# Patient Record
Sex: Male | Born: 1985
Health system: Southern US, Community
[De-identification: ages and names within clinical notes are randomized; demographics above are authoritative.]

## PROBLEM LIST (undated history)

## (undated) DIAGNOSIS — K2270 Barrett's esophagus without dysplasia: Secondary | ICD-10-CM

## (undated) DIAGNOSIS — F419 Anxiety disorder, unspecified: Secondary | ICD-10-CM

## (undated) DIAGNOSIS — F32A Depression, unspecified: Secondary | ICD-10-CM

## (undated) DIAGNOSIS — J939 Pneumothorax, unspecified: Secondary | ICD-10-CM

## (undated) DIAGNOSIS — F329 Major depressive disorder, single episode, unspecified: Secondary | ICD-10-CM

## (undated) HISTORY — PX: PLEURAL SCARIFICATION: SHX748

## (undated) HISTORY — DX: Barrett's esophagus without dysplasia: K22.70

## (undated) HISTORY — DX: Depression, unspecified: F32.A

## (undated) HISTORY — DX: Major depressive disorder, single episode, unspecified: F32.9

---
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
## 2007-02-19 ENCOUNTER — Emergency Department (HOSPITAL_COMMUNITY): Admission: EM | Admit: 2007-02-19 | Discharge: 2007-02-19 | Payer: Self-pay | Admitting: Emergency Medicine

## 2007-02-20 ENCOUNTER — Emergency Department (HOSPITAL_COMMUNITY): Admission: EM | Admit: 2007-02-20 | Discharge: 2007-02-20 | Payer: Self-pay | Admitting: Emergency Medicine

## 2007-04-20 ENCOUNTER — Ambulatory Visit (HOSPITAL_COMMUNITY): Admission: RE | Admit: 2007-04-20 | Discharge: 2007-04-20 | Payer: Self-pay | Admitting: Orthopedic Surgery

## 2007-07-11 ENCOUNTER — Emergency Department (HOSPITAL_COMMUNITY): Admission: EM | Admit: 2007-07-11 | Discharge: 2007-07-12 | Payer: Self-pay | Admitting: Emergency Medicine

## 2007-07-18 ENCOUNTER — Emergency Department (HOSPITAL_COMMUNITY): Admission: EM | Admit: 2007-07-18 | Discharge: 2007-07-18 | Payer: Self-pay | Admitting: Emergency Medicine

## 2007-12-20 ENCOUNTER — Ambulatory Visit: Payer: Self-pay | Admitting: Internal Medicine

## 2007-12-21 ENCOUNTER — Encounter: Payer: Self-pay | Admitting: Internal Medicine

## 2007-12-21 ENCOUNTER — Inpatient Hospital Stay (HOSPITAL_COMMUNITY): Admission: EM | Admit: 2007-12-21 | Discharge: 2007-12-25 | Payer: Self-pay | Admitting: Emergency Medicine

## 2007-12-24 ENCOUNTER — Encounter: Payer: Self-pay | Admitting: Internal Medicine

## 2007-12-25 ENCOUNTER — Emergency Department (HOSPITAL_COMMUNITY): Admission: EM | Admit: 2007-12-25 | Discharge: 2007-12-26 | Payer: Self-pay | Admitting: Emergency Medicine

## 2007-12-26 ENCOUNTER — Ambulatory Visit: Payer: Self-pay | Admitting: Cardiovascular Disease

## 2007-12-26 ENCOUNTER — Inpatient Hospital Stay (HOSPITAL_COMMUNITY): Admission: EM | Admit: 2007-12-26 | Discharge: 2007-12-29 | Payer: Self-pay | Admitting: Emergency Medicine

## 2007-12-28 ENCOUNTER — Encounter (INDEPENDENT_AMBULATORY_CARE_PROVIDER_SITE_OTHER): Payer: Self-pay | Admitting: Internal Medicine

## 2008-01-02 ENCOUNTER — Telehealth: Payer: Self-pay | Admitting: Family Medicine

## 2008-01-03 ENCOUNTER — Ambulatory Visit: Payer: Self-pay | Admitting: Family Medicine

## 2008-01-03 DIAGNOSIS — F3289 Other specified depressive episodes: Secondary | ICD-10-CM | POA: Insufficient documentation

## 2008-01-03 DIAGNOSIS — F411 Generalized anxiety disorder: Secondary | ICD-10-CM | POA: Insufficient documentation

## 2008-01-03 DIAGNOSIS — K279 Peptic ulcer, site unspecified, unspecified as acute or chronic, without hemorrhage or perforation: Secondary | ICD-10-CM | POA: Insufficient documentation

## 2008-01-03 DIAGNOSIS — F329 Major depressive disorder, single episode, unspecified: Secondary | ICD-10-CM | POA: Insufficient documentation

## 2008-01-03 DIAGNOSIS — J4530 Mild persistent asthma, uncomplicated: Secondary | ICD-10-CM | POA: Insufficient documentation

## 2008-01-05 ENCOUNTER — Telehealth: Payer: Self-pay | Admitting: Family Medicine

## 2008-01-30 ENCOUNTER — Telehealth: Payer: Self-pay | Admitting: Family Medicine

## 2008-02-01 ENCOUNTER — Telehealth: Payer: Self-pay | Admitting: Family Medicine

## 2008-02-03 ENCOUNTER — Telehealth: Payer: Self-pay | Admitting: Family Medicine

## 2008-02-06 ENCOUNTER — Ambulatory Visit: Payer: Self-pay | Admitting: Family Medicine

## 2008-02-06 DIAGNOSIS — J209 Acute bronchitis, unspecified: Secondary | ICD-10-CM | POA: Insufficient documentation

## 2008-04-27 ENCOUNTER — Encounter: Payer: Self-pay | Admitting: Family Medicine

## 2008-10-30 ENCOUNTER — Emergency Department (HOSPITAL_COMMUNITY): Admission: EM | Admit: 2008-10-30 | Discharge: 2008-10-30 | Payer: Self-pay | Admitting: Emergency Medicine

## 2009-02-11 ENCOUNTER — Telehealth: Payer: Self-pay | Admitting: Family Medicine

## 2009-03-01 ENCOUNTER — Ambulatory Visit: Payer: Self-pay | Admitting: Family Medicine

## 2009-03-01 DIAGNOSIS — M79609 Pain in unspecified limb: Secondary | ICD-10-CM | POA: Insufficient documentation

## 2009-03-01 DIAGNOSIS — J309 Allergic rhinitis, unspecified: Secondary | ICD-10-CM | POA: Insufficient documentation

## 2009-03-13 ENCOUNTER — Telehealth: Payer: Self-pay | Admitting: Family Medicine

## 2009-05-22 ENCOUNTER — Telehealth: Payer: Self-pay | Admitting: Family Medicine

## 2009-07-21 ENCOUNTER — Emergency Department (HOSPITAL_COMMUNITY)
Admission: EM | Admit: 2009-07-21 | Discharge: 2009-07-22 | Payer: Self-pay | Source: Home / Self Care | Admitting: Emergency Medicine

## 2009-07-21 ENCOUNTER — Emergency Department (HOSPITAL_COMMUNITY): Admission: EM | Admit: 2009-07-21 | Discharge: 2009-07-21 | Payer: Self-pay | Admitting: Emergency Medicine

## 2009-07-31 ENCOUNTER — Telehealth (INDEPENDENT_AMBULATORY_CARE_PROVIDER_SITE_OTHER): Payer: Self-pay | Admitting: *Deleted

## 2009-08-06 ENCOUNTER — Encounter: Payer: Self-pay | Admitting: Family Medicine

## 2009-10-03 ENCOUNTER — Encounter: Payer: Self-pay | Admitting: Family Medicine

## 2009-12-16 ENCOUNTER — Telehealth: Payer: Self-pay | Admitting: Family Medicine

## 2010-01-08 ENCOUNTER — Encounter: Payer: Self-pay | Admitting: Internal Medicine

## 2010-01-09 IMAGING — CT CT ENTERO PELVIS W/CM
2 of 6 series · 16 of 46 positions shown, 18 images · IV contrast (agent unspecified)
Comparison: 12/19/2007.

CT ABDOMEN:

CLINICAL DATA: Nausea vomiting diarrhea.  Colitis.  Bloody stools.
Bleeding ulcers in the esophagus.  Clinical question of Crohn's
disease.

CT ABDOMEN AND PELVIS WITH CONTRAST (CT ENTEROGRAPHY)
TECHNIQUE: Multidetector CT of the abdomen and pelvis during bolus
administration of intravenous contrast. Negative oral contrast
VoLumen was given.
Contrast: 100 ml Tmnipaque-ZLL intravenously and 6459 ml VoLumen
orally.

[Series 3: abd_pel 3.0 b40s do not sen · axial · 0.67mm/px · z∈[-472,-88]mm · 13 of 148 slices shown, 15 images]
[im 10/148  soft-tissue]
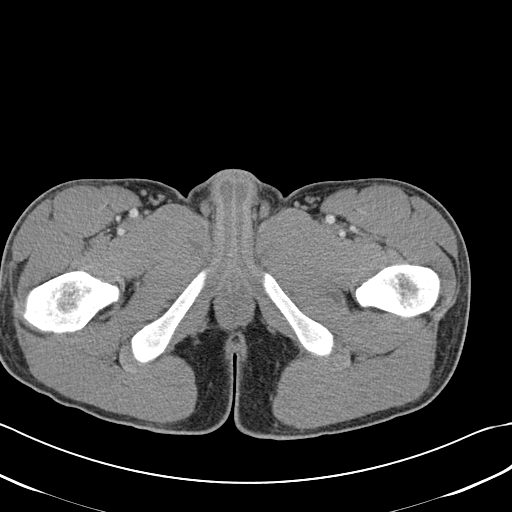
[im 10/148  bone]
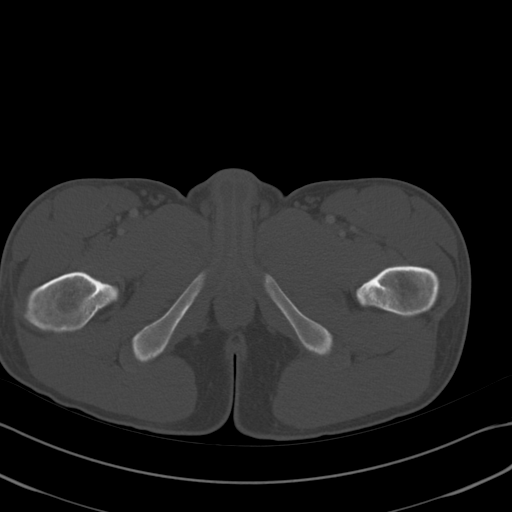
[im 20/148  soft-tissue]
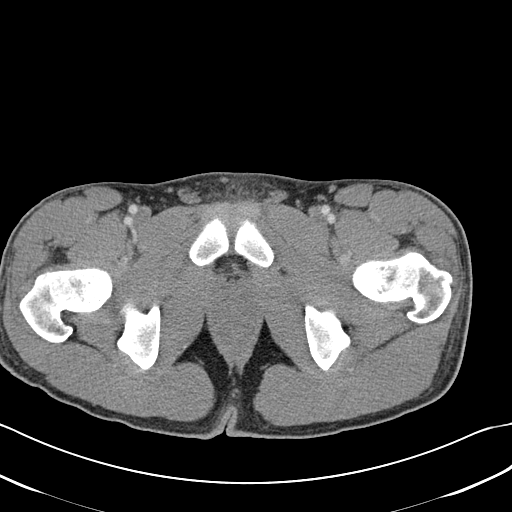
[im 30/148  soft-tissue]
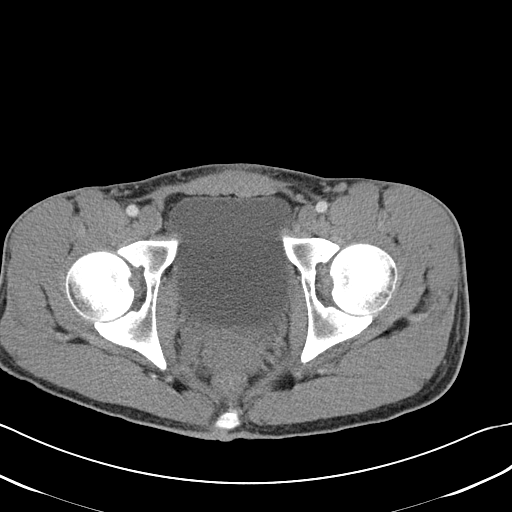
[im 40/148  soft-tissue]
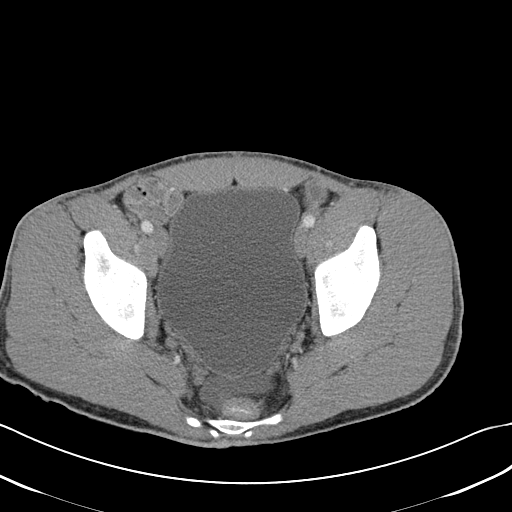
[im 50/148  soft-tissue]
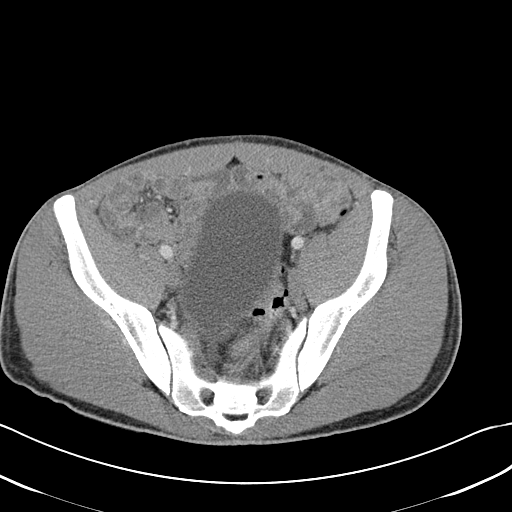
[im 59/148  soft-tissue]
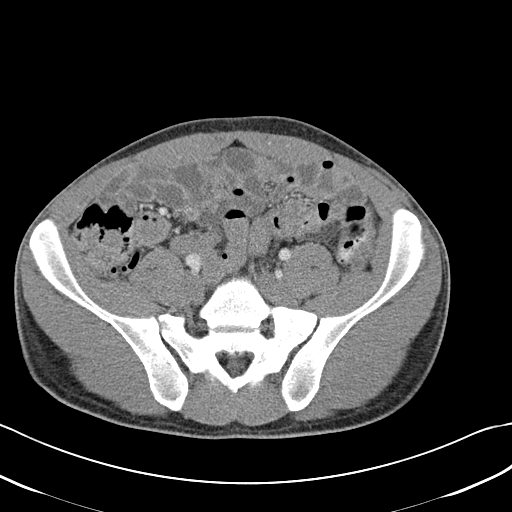
[im 79/148  soft-tissue]
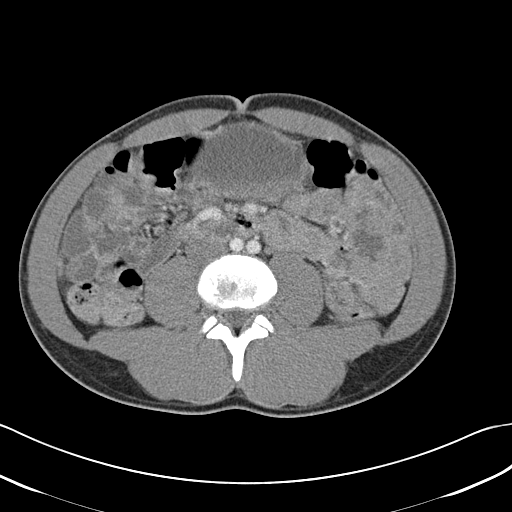
[im 89/148  soft-tissue]
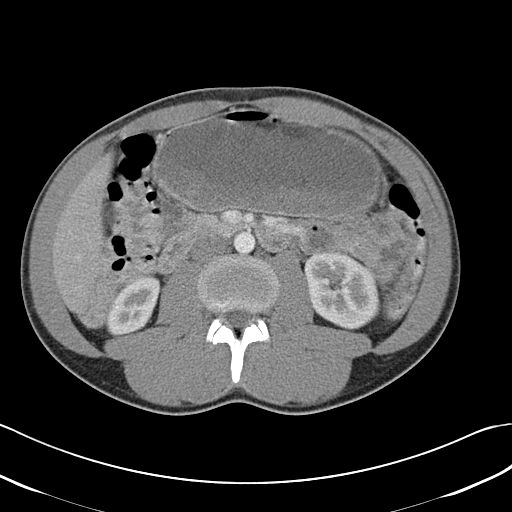
[im 99/148  soft-tissue]
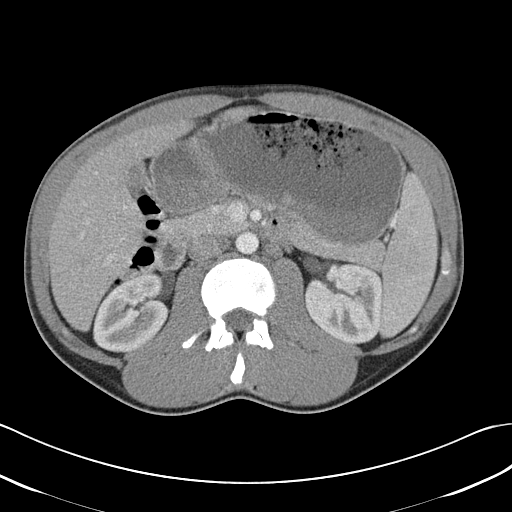
[im 99/148  bone]
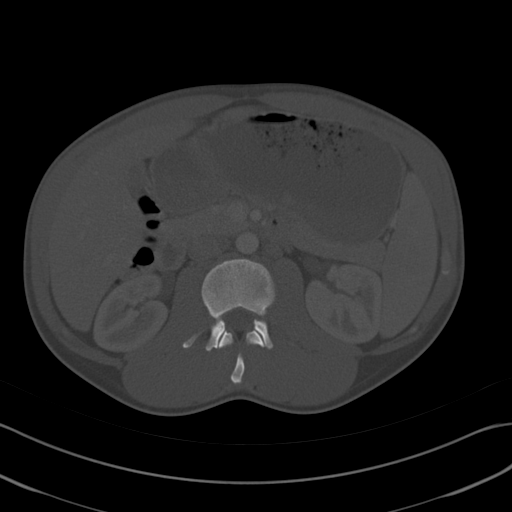
[im 108/148  soft-tissue]
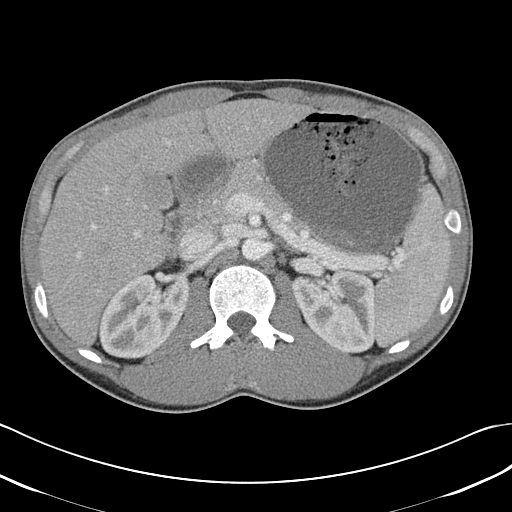
[im 118/148  soft-tissue]
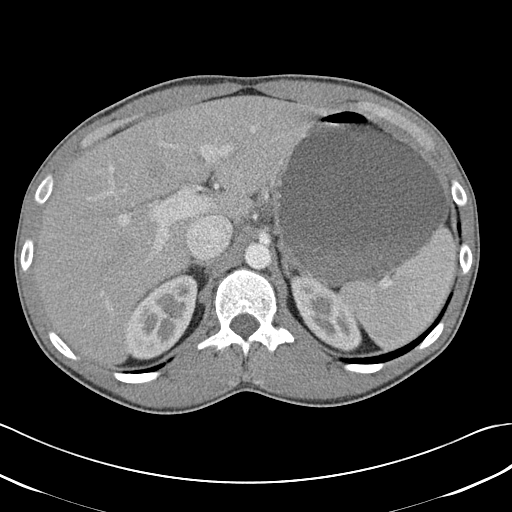
[im 128/148  soft-tissue]
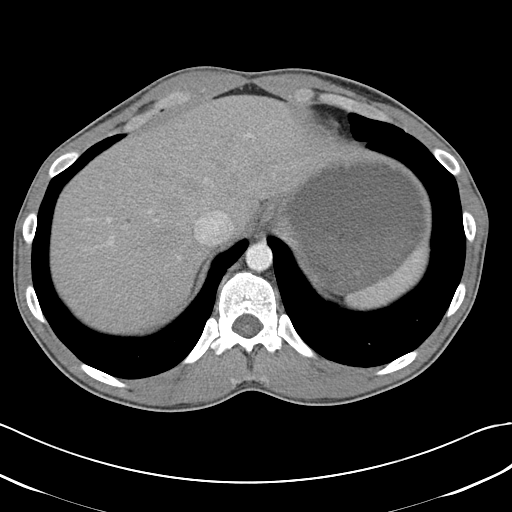
[im 138/148  soft-tissue]
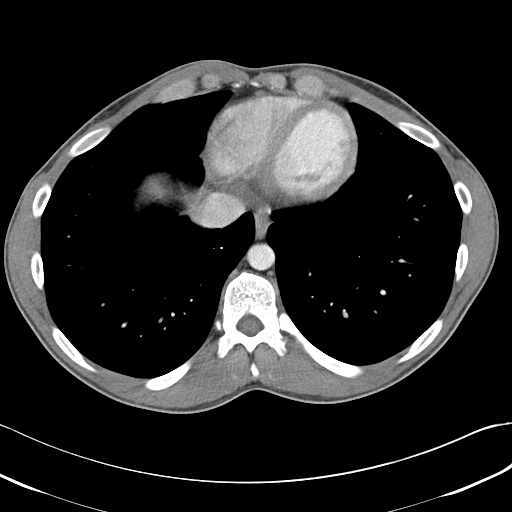

[Series 602: <mpr thick range> · coronal · 0.87mm/px · 3 of 79 slices shown]
[im 27/79  soft-tissue]
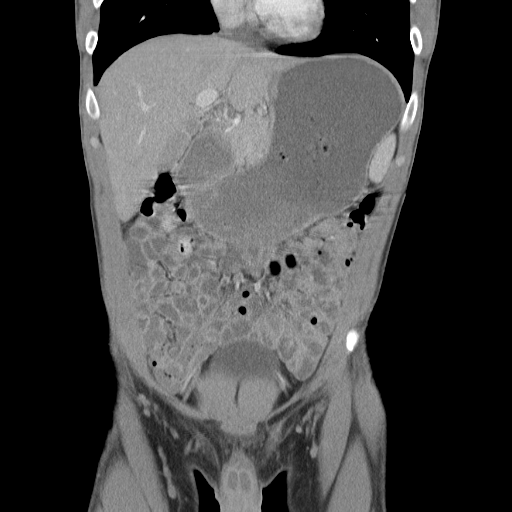
[im 35/79  soft-tissue]
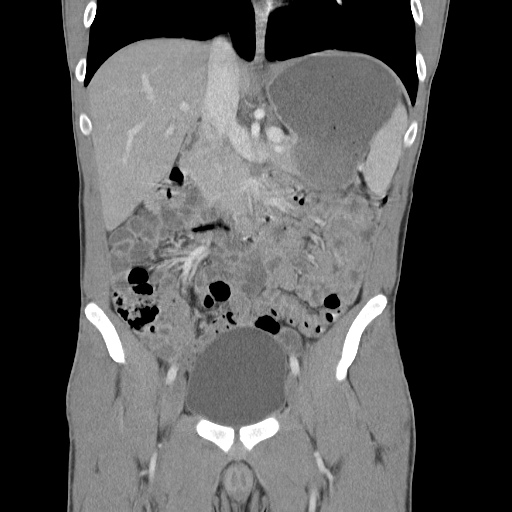
[im 44/79  soft-tissue]
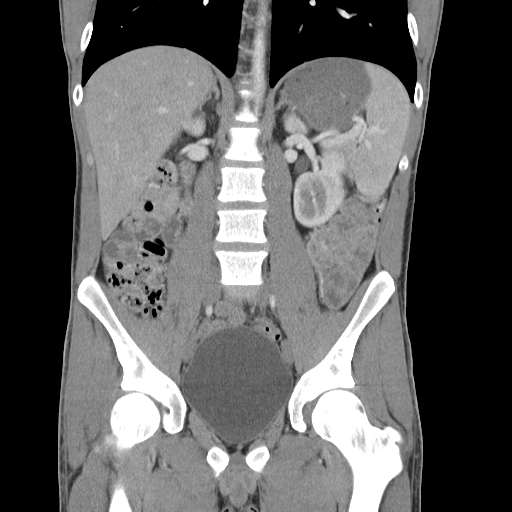

[16 of 46 positions shown; findings below may reference images not displayed]

FINDINGS: The stomach is more distended than typically seen for CT
enterography, suggesting a component of delayed gastric emptying.
There is no small bowel dilatation.  No abnormal enhancement is
identified within the small bowel.  There is no small bowel wall
thickening.  The area of mid small bowel wall thickening seen on
the previous study is not evident on this exam.  The colon is
poorly distended and shows no evidence for wall thickening or
abnormal mucosal enhancement.  The terminal ileum is normal.  The
appendix is normal.

The liver, spleen, pancreas, adrenal glands, and kidneys have
normal imaging features.

Trace mesenteric edema is seen in the right abdomen although no
free intraperitoneal fluid is evident.
IMPRESSION: No evidence for abnormal mucosal or mural enhancement within the
larger small bowel.  There is no large or small bowel dilatation.

Marked distention of the stomach.  While the patient did drink a
large volume of oral contrast for this study, the gastric
distention is greater than typically seen during CT enterography.
Additionally, there is residual food and layering debris within the
gastric lumen.  As such, a component of delayed gastric emptying is
suspected.  The stomach was not markedly distended on the previous
study.

CT PELVIS:
FINDINGS: There is a trace amount of free fluid in the peritoneal
cavity, abnormal in a male patient.  The bladder is markedly
distended.  No pelvic sidewall lymphadenopathy.  No evidence for
mesenteric lymphadenopathy.

Bone windows show no worrisome lytic or sclerotic osseous lesions.
IMPRESSION: Trace amount of intraperitoneal free fluid.  This is abnormal in a
male patient although an etiology is not evident on today's CT
scan.  This free fluid represents a new interval findings.

## 2010-01-28 ENCOUNTER — Encounter: Payer: Self-pay | Admitting: Internal Medicine

## 2010-01-30 ENCOUNTER — Ambulatory Visit
Admission: RE | Admit: 2010-01-30 | Discharge: 2010-01-30 | Payer: Self-pay | Source: Home / Self Care | Attending: Family Medicine | Admitting: Family Medicine

## 2010-01-30 DIAGNOSIS — M6740 Ganglion, unspecified site: Secondary | ICD-10-CM | POA: Insufficient documentation

## 2010-01-30 DIAGNOSIS — L219 Seborrheic dermatitis, unspecified: Secondary | ICD-10-CM | POA: Insufficient documentation

## 2010-02-18 NOTE — Progress Notes (Signed)
Summary: needs rx  Phone Note Call from Patient   Caller: Patient Call For: Travis [name] Summary of Call: found psychitrist in [place], pt is sitting in parking lot right now and he is not the dr took that day off and they did not call him cause they said they didn't have a valid phone number.  Pt is out of xanax and propranolol 40mg .  He is going to find another psychitrist and wants to know if you can call him more until he gets an appt. or supply [facility] Initial call taken by: Alfred Levins, CMA,  February 11, 2009 10:59 AM  Follow-up for Phone Call        pt called back and feels like he is going to cry.  He has been out for 2 days and needs to be at work at 5:30pm Follow-up by: Alfred Levins, CMA,  February 11, 2009 1:57 PM  Additional Follow-up for Phone Call Additional follow up Details #1::        call in one month of xanax (#90) and citalopram (#30) Additional Follow-up by: Nelwyn Salisbury MD,  February 11, 2009 3:30 PM    Additional Follow-up for Phone Call Additional follow up Details #2::    rx's called in, pt aware Follow-up by: Alfred Levins, CMA,  February 11, 2009 4:08 PM  Prescriptions: CITALOPRAM HYDROBROMIDE 20 MG TABS (CITALOPRAM HYDROBROMIDE) 1 by mouth once daily  #30 x 2   Entered by:   Alfred Levins, CMA   Authorized by:   Nelwyn Salisbury MD   Signed by:   Alfred Levins, CMA on 02/11/2009   Method used:   Telephoned to ...       [facility]  [address] [id number]* (retail)       [address]       [place], [place]  [address]       Ph: [phone] or [phone]       Fax: [phone]   RxID:   [id number] ALPRAZOLAM 2 MG TABS (ALPRAZOLAM) three times a day  #90 x 0   Entered by:   Alfred Levins, CMA   Authorized by:   Nelwyn Salisbury MD   Signed by:   Alfred Levins, CMA on 02/11/2009   Method used:   Telephoned to ...       [facility]  [address] [id number]* (retail)       [address]       [place], [place]   [address]       Ph: [phone] or [phone]       Fax: [phone]   RxID:   [id number]

## 2010-02-18 NOTE — Progress Notes (Signed)
Summary: refill  Phone Note Call from Patient Call back at Home Phone [phone]   Caller: pt live Call For: Clent Ridges  Summary of Call: Pt states Dr Clent Ridges is his primary care physician.  He was in [place] for an anxiety disorder.  On discharge they told him if he needs a refill he has to see his physchiatrist or call his primary care physician.  He has an appt on 12-30 with the physchiatrist.  He needs a refill on adavan lorizapam 1 mg [facility] [address]   Initial call taken by: Roselle Locus,  January 02, 2008 10:39 AM  Follow-up for Phone Call        I have not seen him in almost 5 years. I cannot prescribe this med without an OV. Follow-up by: Nelwyn Salisbury MD,  January 02, 2008 2:12 PM  Additional Follow-up for Phone Call Additional follow up Details #1::        lm hm  Roselle Locus  January 02, 2008 3:47 PM

## 2010-02-18 NOTE — Progress Notes (Signed)
Summary: PA call  Phone Note Call from Patient   Summary of Call: Called about omeprazole needing PA & why after being on it over a year?  Call insurance company for details.  We will do the PA when paperwork is received from drugstore requesting.   Another call left on Triage VM - Insurance has started process, has faxed questionnaire to you.  Or you can call them in am & answer questions.  Rudy Jew, RN  July 31, 2009 5:16 PM  Initial call taken by: Rudy Jew, RN,  July 31, 2009 4:38 PM

## 2010-02-18 NOTE — Progress Notes (Signed)
Summary: still sick  Phone Note Call from Patient   Caller: Patient Call For: Nelwyn Salisbury MD Summary of Call: Was seen 10 days ago, not getting better- took Z-pak. C/o coughing all night, thick green mucous, chills, sweats and ears hurt. His phone [phone] [facility] Initial call taken by: Raechel Ache, RN,  March 13, 2009 9:10 AM  Follow-up for Phone Call        call in Augmentin 875 two times a day for 10 days Follow-up by: Nelwyn Salisbury MD,  March 13, 2009 1:30 PM  Additional Follow-up for Phone Call Additional follow up Details #1::        Rx Called In Additional Follow-up by: Raechel Ache, RN,  March 13, 2009 1:48 PM    New/Updated Medications: AUGMENTIN 875-125 MG TABS (AMOXICILLIN-POT CLAVULANATE) 1 two times a day Prescriptions: AUGMENTIN 875-125 MG TABS (AMOXICILLIN-POT CLAVULANATE) 1 two times a day  #20 x 0   Entered by:   Raechel Ache, RN   Authorized by:   Nelwyn Salisbury MD   Signed by:   Raechel Ache, RN on 03/13/2009   Method used:   Electronically to        [facility]  [address] [id number] (retail)       [address]       [place], [place]  [address]       Ph: [phone] or [phone]       Fax: [phone]   RxID:   [id number]

## 2010-02-18 NOTE — Progress Notes (Signed)
Summary: requesting different med  Phone Note Call from Patient   Caller: Patient Call For: Nelwyn Salisbury MD Summary of Call: Pt is taking 80 mg. of Omeprazole some days, and that is not controlling his GERD.  Wants to a different RX.  [facility]) [phone] Initial call taken by: Lynann Beaver CMA,  May 22, 2009 12:10 PM  Follow-up for Phone Call        this needs special authorization for his insurance, so he needs to ask Dr. Juanda Chance about this Follow-up by: Nelwyn Salisbury MD,  May 22, 2009 5:26 PM  Additional Follow-up for Phone Call Additional follow up Details #1::        Pt. notified. Additional Follow-up by: Lynann Beaver CMA,  May 23, 2009 8:13 AM

## 2010-02-18 NOTE — Progress Notes (Signed)
Summary: ? cold sore   Phone Note Call from Patient   Caller: Patient Summary of Call: has had ?fcold sore on lip  last 4 days "real swollen using h2o2  and carmex says its embarassing  would like something called [facility]  [phone]  nkda  Initial call taken by: Pura Spice, RN,  December 16, 2009 11:09 AM  Follow-up for Phone Call        he needs an OV. I have never diagnosed him with cold sores, so I need to make sure what this is  Follow-up by: Nelwyn Salisbury MD,  December 16, 2009 1:25 PM  Additional Follow-up for Phone Call Additional follow up Details #1::        pt aware. Additional Follow-up by: Pura Spice, RN,  December 16, 2009 1:43 PM

## 2010-02-18 NOTE — Assessment & Plan Note (Signed)
Summary: COLD, CONGESTION, FEVER // RS   Vital Signs:  Patient profile:   25 year old male Weight:      143 pounds O2 Sat:      97 % on Room air Temp:     98.6 degrees F Pulse rate:   92 / minute BP sitting:   110 / 74  Vitals Entered By: Pura Spice, RN (March 01, 2009 1:20 PM)  O2 Flow:  Room air CC: achy all over cough clear to dark runny nose with drainage.  Is Patient Diabetic? No   History of Present Illness: here with 3 problems. First,  2 days ago he developed fevers, body aches, and coughing up green sputum. No NVD. Drinking fluids and taking Motrin. Second, for several months he has had frequent burning in the nose with clear drainage and sneezing. Third, for a month or so he has had pain inthe PIP joint of the left 3rd finger. No swelling. 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   Preventive Screening-Counseling & Management  Alcohol-Tobacco     Smoking Status: current     Packs/Day: 1.0     Year Started: 2003  Allergies (verified): No Known Drug Allergies  Past History:  Past Medical History: Reviewed history from 02/06/2008 and no changes required. Anxiety Depression, sees Dr. Dallas Schimke in [place] [facility] Peptic ulcer disease, sees Dr. Juanda Chance  Social History: Packs/Day:  1.0  Review of Systems  The patient denies anorexia, weight loss, weight gain, vision loss, decreased hearing, hoarseness, chest pain, syncope, dyspnea on exertion, peripheral edema, headaches, hemoptysis, abdominal pain, melena, hematochezia, severe indigestion/heartburn, hematuria, incontinence, genital sores, muscle weakness, suspicious skin lesions, transient blindness, difficulty walking, depression, unusual weight change, abnormal bleeding, enlarged lymph nodes, angioedema, breast masses, and testicular masses.    Physical Exam  General:  Well-developed,well-nourished,in no acute distress; alert,appropriate and cooperative throughout  examination Head:  Normocephalic and atraumatic without obvious abnormalities. No apparent alopecia or balding. Eyes:  No corneal or conjunctival inflammation noted. EOMI. Perrla. Funduscopic exam benign, without hemorrhages, exudates or papilledema. Vision grossly normal. Ears:  External ear exam shows no significant lesions or deformities.  Otoscopic examination reveals clear canals, tympanic membranes are intact bilaterally without bulging, retraction, inflammation or discharge. Hearing is grossly normal bilaterally. Nose:  External nasal examination shows no deformity or inflammation. Nasal mucosa are pink and moist without lesions or exudates. Mouth:  Oral mucosa and oropharynx without lesions or exudates.  Teeth in good repair. Neck:  No deformities, masses, or tenderness noted. Lungs:  Normal respiratory effort, chest expands symmetrically. Lungs are clear to auscultation, no crackles or wheezes. Extremities:  the left 3rd PIP joint is tender but not swollen, full ROM   Impression & Recommendations:  Problem # 1:  ACUTE BRONCHITIS (ICD-466.0)  The following medications were removed from the medication list:    Symbicort 80-4.5 Mcg/act Aero (Budesonide-formoterol fumarate) .Marland Kitchen..Marland Kitchen Two times a day His updated medication list for this problem includes:    Accuneb 0.63 Mg/45ml Nebu (Albuterol sulfate) ..... Every 4 hours as needed    Proventil Hfa 108 (90 Base) Mcg/act Aers (Albuterol sulfate) .Marland Kitchen... 1-2 puffs every 4-6 hours as needed    Zithromax Z-pak 250 Mg Tabs (Azithromycin) .Marland Kitchen... As directed  Problem # 2:  ATOPIC RHINITIS (ICD-477.9)  His updated medication list for this problem includes:    Nasonex 50 Mcg/act Susp (Mometasone furoate) .Marland Kitchen... 2 sprays each nostril once daily  Problem # 3:  FINGER PAIN (ICD-729.5)  Complete Medication List: 1)  Citalopram Hydrobromide 20 Mg Tabs (Citalopram hydrobromide) .Marland Kitchen.. 1 by mouth once daily 2)  Alprazolam 2 Mg Tabs (Alprazolam) .... Three  times a day 3)  Accuneb 0.63 Mg/61ml Nebu (Albuterol sulfate) .... Every 4 hours as needed 4)  Proventil Hfa 108 (90 Base) Mcg/act Aers (Albuterol sulfate) .Marland Kitchen.. 1-2 puffs every 4-6 hours as needed 5)  Omeprazole 40 Mg Cpdr (Omeprazole) .... Once daily 6)  Zithromax Z-pak 250 Mg Tabs (Azithromycin) .... As directed 7)  Nasonex 50 Mcg/act Susp (Mometasone furoate) .... 2 sprays each nostril once daily  Patient Instructions: 1)  rest, meds as above. off work [employer]. Suggested he buddy tape the left 3rd and 4th fingers together for support.   2)  Please schedule a follow-up appointment as needed .  Prescriptions: NASONEX 50 MCG/ACT SUSP (MOMETASONE FUROATE) 2 sprays each nostril once daily  #30 x 11   Entered and Authorized by:   Nelwyn Salisbury MD   Signed by:   Nelwyn Salisbury MD on 03/01/2009   Method used:   Electronically to        [facility]  [address] [id number] (retail)       [address]       [place], [place]  [address]       Ph: [phone] or [phone]       Fax: [phone]   RxID:   [id number] Z-PAK 250 MG TABS (AZITHROMYCIN) as directed  #1 x 0   Entered and Authorized by:   Nelwyn Salisbury MD   Signed by:   Nelwyn Salisbury MD on 03/01/2009   Method used:   Electronically to        [facility]  [address] [id number] (retail)       [address]       [place], [place]  [address]       Ph: [phone] or [phone]       Fax: [phone]   RxID:   [id number]

## 2010-02-18 NOTE — Letter (Signed)
Summary: [facility]  [facility]   Imported By: Maryln Gottron 10/11/2009 14:57:21  _____________________________________________________________________  External Attachment:    Type:   Image     Comment:   External Document

## 2010-02-20 NOTE — Assessment & Plan Note (Signed)
Summary: scalp issue/njr   Vital Signs:  Patient profile:   24 year old male Weight:      157 pounds O2 Sat:      94 % Temp:     98.4 degrees F Pulse rate:   98 / minute BP sitting:   104 / 72  (left arm) Cuff size:   regular  Vitals Entered By: Pura Spice, RN (January 30, 2010 11:06 AM) CC: wants dandruff med allergy med and check cyst left palm hand    History of Present Illness: here for 3 issues. First he has had bad itching and flaking of the scalp for years , and he has tried every OTC product that he knows of with little effect. Second, he has daily allergies cauing itchy eyes, runny nose, and sneezing. This has not responded to a number of OTC antihistamines. third, for 6 months he has had a tiny tender nodule in the left hand that swells and gets painful at times. This is worse when he lifts weights at the gym or when lifting boxes at his job. No trauma hx.   Allergies (verified): No Known Drug Allergies  Past History:  Past Medical History: Reviewed history from 02/06/2008 and no changes required. Anxiety Depression, sees Dr. Dallas Schimke in [place] [facility] Peptic ulcer disease, sees Dr. Juanda Chance  Past Surgical History: Reviewed history from 01/03/2008 and no changes required. EGD per Dr. Juanda Chance 12-09, bleeding ulcers  Review of Systems  The patient denies anorexia, fever, weight loss, weight gain, vision loss, decreased hearing, hoarseness, chest pain, syncope, dyspnea on exertion, peripheral edema, prolonged cough, headaches, hemoptysis, abdominal pain, melena, hematochezia, severe indigestion/heartburn, hematuria, incontinence, genital sores, muscle weakness, suspicious skin lesions, transient blindness, difficulty walking, depression, unusual weight change, abnormal bleeding, enlarged lymph nodes, angioedema, breast masses, and testicular masses.    Physical Exam  General:  Well-developed,well-nourished,in no acute distress; alert,appropriate and  cooperative throughout examination Head:  Normocephalic and atraumatic without obvious abnormalities. No apparent alopecia or balding. Eyes:  No corneal or conjunctival inflammation noted. EOMI. Perrla. Funduscopic exam benign, without hemorrhages, exudates or papilledema. Vision grossly normal. Ears:  External ear exam shows no significant lesions or deformities.  Otoscopic examination reveals clear canals, tympanic membranes are intact bilaterally without bulging, retraction, inflammation or discharge. Hearing is grossly normal bilaterally. Nose:  External nasal examination shows no deformity or inflammation. Nasal mucosa are pink and moist without lesions or exudates. Mouth:  Oral mucosa and oropharynx without lesions or exudates.  Teeth in good repair. Neck:  No deformities, masses, or tenderness noted. Lungs:  Normal respiratory effort, chest expands symmetrically. Lungs are clear to auscultation, no crackles or wheezes. Extremities:  tiny mobile firm tender nodule on the flexor side of the right 3rd MCP. Full ROM  Skin:  the scalp is flaky all over with no visible rash    Impression & Recommendations:  Problem # 1:  ALLERGIC RHINITIS (ICD-477.9)  His updated medication list for this problem includes:    Flonase 50 Mcg/act Susp (Fluticasone propionate) .Marland Kitchen... 2 sprays each nostril daily    Xyzal 5 Mg Tabs (Levocetirizine dihydrochloride) ..... 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 1 by mouth once daily 2)  Alprazolam 2 Mg Tabs (Alprazolam) .... Three times a day 3)  Accuneb 0.63 Mg/5ml Nebu (Albuterol sulfate) .... Every 4 hours as needed  4)  Proventil Hfa 108 (90 Base) Mcg/act Aers (Albuterol sulfate) .Marland Kitchen.. 1-2 puffs every 4-6 hours as needed 5)  Omeprazole 40 Mg Cpdr (Omeprazole) .... Once daily 6)  Flonase 50 Mcg/act Susp  (Fluticasone propionate) .... 2 sprays each nostril daily 7)  Augmentin 875-125 Mg Tabs (Amoxicillin-pot clavulanate) .Marland Kitchen.. 1 two times a day 8)  Loprox 1 % Sham (Ciclopirox) .... Apply once daily 9)  Xyzal 5 Mg Tabs (Levocetirizine dihydrochloride) .... Once daily  Patient Instructions: 1)  as for the cyst, I advised him to back off on weight lifting for awhile and to wear padded gloves when he does. Use Loprox for the scalp. Use Xyzal for the allergies.  2)  Please schedule a follow-up appointment as needed .  Prescriptions: XYZAL 5 MG TABS (LEVOCETIRIZINE DIHYDROCHLORIDE) once daily  #30 x 11   Entered and Authorized by:   Nelwyn Salisbury MD   Signed by:   Nelwyn Salisbury MD on 01/30/2010   Method used:   Electronically to        [facility]  [address] [id number] (retail)       [address]       [place], [place]  [address]       Ph: [phone] or [phone]       Fax: [phone]   RxID:   [id number] LOPROX 1 % SHAM (CICLOPIROX) apply once daily  #30 x 11   Entered and Authorized by:   Nelwyn Salisbury MD   Signed by:   Nelwyn Salisbury MD on 01/30/2010   Method used:   Electronically to        [facility]  [address] [facility] (retail)       [address]       [place], [place]  [address]       Ph: [phone] or [phone]       Fax: [phone]   RxID:   [id number]    Orders Added: 1)  Est. Patient Level IV [51761]

## 2010-02-20 NOTE — Letter (Signed)
Summary: Endoscopy Letter  [facility]  [address] [place], [place] [address]   Phone: [phone]  Fax: [phone]      January 28, 2010 MRN: [id number]   [facility] [address] [name], [place]  [address]   Dear Travis [name],   According to your medical record, it is time for you to schedule an Endoscopy. Endoscopic screening is recommended for patients with certain upper digestive tract conditions because of associated increased risk for cancers of the upper digestive system.  This letter has been generated based on the recommendations made at the time of your prior procedure. If you feel that in your particular situation this may no longer apply, please contact our office.  Please call our office at [phone]) to schedule this appointment or to update your records at your earliest convenience.  Thank you for cooperating with [place] to provide you with the very best care possible.   Sincerely,  Hedwig Morton. Juanda Chance, M.D.  [facility] [phone]

## 2010-02-20 NOTE — Procedures (Signed)
Summary: Recall Assessment/[facility]  Recall Assessment/[facility]   Imported By: Sherian Rein 02/03/2010 15:48:17  _____________________________________________________________________  External Attachment:    Type:   Image     Comment:   External Document

## 2010-02-21 NOTE — Medication Information (Signed)
Summary: Omeprazole Approved  Omeprazole Approved   Imported By: Maryln Gottron 08/09/2009 13:53:47  _____________________________________________________________________  External Attachment:    Type:   Image     Comment:   External Document

## 2010-06-03 NOTE — H&P (Signed)
Travis [name], Travis [name]               ACCOUNT NO.:  [id number]   MEDICAL RECORD NO.:  [id number]          PATIENT TYPE:  EMS   LOCATION:  ED                           FACILITY:  [facility]   PHYSICIAN:  Beckey Rutter, MD  DATE OF BIRTH:  01-26-1985   DATE OF ADMISSION:  12/19/2007  DATE OF DISCHARGE:                              HISTORY & PHYSICAL   PRIMARY CARE PHYSICIAN:  Unassigned.   CHIEF COMPLAINT:  Abdominal discomfort, nausea, vomiting and diarrhea.   HISTORY OF PRESENT ILLNESS:  Travis [name] is a pleasant 25 year old  Caucasian male with past medical history significant for bronchial  asthma and history of a spontaneous pneumothorax status post chest tube.  The patient was in his usual state of health up until midnight when he  started to feel abdominal discomfort.  The patient then vomited more  than 10 times and he had diarrhea also more than 10 times.  The patient  was feeling very nauseous and that is why he presented himself to the  emergency department.  He stated that the diarrhea was tarry but seems  to be mixed with blood.  The patient currently complaining of 2 out of  10 abdominal discomfort.  He is status post narcotic injection here in  the emergency department.   PAST MEDICAL HISTORY:  1. Spontaneous pneumothorax status post chest tubes, the patient has      been stable since that admission.  2. Bronchial asthma, he used Symbicort and Proventil as needed.   SOCIAL HISTORY:  The patient lives with his family.  He denied regular  use of ethanol.  He is a smoker, one pack per day.  He uses marijuana  now and then.   FAMILY HISTORY:  Family history is not significant for chronic or  related disease.   MEDICATION ALLERGIES:  NOT KNOWN TO HAVE MEDICATION ALLERGIES.   MEDICATIONS:  1. Symbicort twice a day.  2. Proventil inhaler as needed.   REVIEW OF SYSTEMS:  A 12 point review of systems is noncontributory,  otherwise as per HPI.   EXAMINATION:  Temperature  is 97.9, blood pressure 106/57, pulse 90,  respiratory rate is 18.  HEAD:  Atraumatic/normocephalic.  EYES:  PERRL.  MOUTH:  Moist, no ulcer.  NECK:  Supple, no JVD.  LUNGS:  Bilateral fair air entry, no adventitious sounds.  ABDOMEN:  Soft, nontender, bowel sounds present.  EXTREMITIES:  No lower extremity edema.  GENITOURINARY:  No CVA tenderness.  NEUROLOGICALLY:  Patient is alert, oriented x3, moving all his  extremities spontaneously.   LABS AND X-RAYS:  PTT is 25, PT is 14.5, INR is 1.1, microscopic urine  showing many bacteria.  Urine analysis is cloudy, negative for nitrite  and small leukocyte esterase.  The patient has more than 80 mg/dL of  ketones.  Lipase is 24 sodium is 138, potassium 4.1, chloride 103,  CO2/bicarb is 19, glucose 149, BUN is 15, creatinine 1.1, white blood  count is 15.7, hemoglobin is 18.6, hematocrit is 54, platelet count is  172.   ASSESSMENT AND PLAN:  This is a 25 year old with  nausea, vomiting and  bloody diarrhea of acute onset.  I suspect this is food poisoning,  although it is unlikely to be Shiga toxin-producing Escherichia coli.  It might very well be staph food poisoning, although the whole spectrum  of food poisoning could not be excluded.  The patient ate pizza with  bacon prior to his symptoms.  1. The patient will be admitted for further assessment and management.  2. Will start the patient on intravenous fluids to correct his      dehydration status.  3. Will send stool for white blood count and the patient will be kept      NPO with antiemetic medication.  4. The patient has leukocytosis and some white blood count in his      urine which is unexplainable at this time.  I will send blood      culture and start the patient on ciprofloxacin.  The patient then      will be monitored for blood in the, and will check his guaiac now.      At this time I do not think the patient will need further testing      since it seems to be food  poisoning.  Nevertheless,  depending on      the patient's hospital course will consider gastroenterology or      infectious disease consultation.  5. For prophylaxis will start the patient on sequential pneumatic      device and intravenous Protonix.      Beckey Rutter, MD  Electronically Signed     EME/MEDQ  D:  12/19/2007  T:  12/19/2007  Job:  [id number]

## 2010-06-03 NOTE — Discharge Summary (Signed)
Travis [name], Travis [name]               ACCOUNT NO.:  [id number]   MEDICAL RECORD NO.:  [id number]          PATIENT TYPE:  INP   LOCATION:  1519                         FACILITY:  [facility]   PHYSICIAN:  Monte Fantasia, MD  DATE OF BIRTH:  1985-05-05   DATE OF ADMISSION:  12/19/2007  DATE OF DISCHARGE:  12/25/2007                               DISCHARGE SUMMARY   DISCHARGE DIAGNOSES:  1. Infectious diarrhea.  2. Barrett's esophagus.  3. Possible enteritis.   DISCHARGE MEDICATIONS:  1. Ciprofloxacin 500 mg p.o. every 12.  2. Dicyclomine 20 mg p.o. q.i.d.  3. Nicotine patch 21 mg per 24 hours patch daily.  4. Protonix 40 mg p.o. b.i.d.  5. Sucralfate 1 gram p.o. q.i.d.   COURSE DURING HOSPITAL STAY:  [age]-year-old Caucasian male patient  with significant history for bronchial asthma and spontaneous  pneumothorax was admitted with complaint of diarrhea, nausea, vomiting.  The patient was admitted on December 2 for the same and was started on  IV fluid hydration.  The patient was started on ciprofloxacin for the  same.  C. diff had been negative.  The patient was evaluated by GI with  impression of acute gastroenteritis, long-standing GERD and with peptic  ulcer disease.  Patient underwent EGD on December 2 for the same, which  showed erosions and inflammation of the esophagus suggestive of grade 1  esophagitis, reflux esophagitis and biopsies were taken.  The biopsies  were reported as Barrett's esophagus, intestinal metaplasia suggestive  of Barrett's esophagus.  No evidence of dysphagia or malignancy.  The  patient was started on PPI and Carafate for the same.  However, patient  still had some persistent diarrhea.  The patient underwent CT  enterography for the same which showed just minimal amount of fluid in  the pelvis and no evidence of abnormal mucosa or mural enhancement  within the large bowel.  The patient was continued on the ciprofloxacin,  the PPI and responded  gradually.  At present the patient has had no  episodes of diarrhea and is stable enough to be discharged.   PROCEDURES DONE DURING HOSPITAL STAY:  Upper GI endoscopy done December  2.   RADIOLOGICAL INVESTIGATIONS DURING HOSPITAL STAY:  1. CT enterography of the abdomen and pelvis.  Impression:  No      evidence of abnormal mucosa or mural enhancement within the large      bowel.  There is no large or small bowel diltation.  Marked      distension of the stomach.  While the patient did drink a large      volume of oral contrast for the study, the gastric distension is      greater than typically seen during CT enterography.  Additionally      there is residual food within the gastric lumen.  A component of      delayed gastric emptying is suspected.  Stomach was not markedly      dilated on the previous study.  2. CT of the pelvis.  Impression:  Trace amount of intraperitoneal      free fluid.  This is abnormal in a male patient, although an      etiology is not evident on today's CT.   LABORATORIES ON DISCHARGE:  Total WBC 6.4, hemoglobin 16.41, hematocrit  45.8, platelets 162.  Sodium 140, potassium 3.8, chloride 107, bicarb  30, glucose 160, BUN 8, creatinine 0.9.  Calcium 9.1, phosphorous 4.0,  magnesium 2.2.  Helicobacter screen has been negative.  C. diff toxin  has been negative.  Stool fecal lactoferrin is possible.   ASSESSMENT/PLAN:  The patient has been treated for possible acute  gastroenteritis with Barrett's esophagus.  Patient has been advised to  continue ciprofloxacin for five more days and advised diet changes in  view of the same.  Advised to follow up with the primary care physician.      Monte Fantasia, MD  Electronically Signed     MP/MEDQ  D:  12/25/2007  T:  12/25/2007  Job:  [id number]

## 2010-06-03 NOTE — Consult Note (Signed)
NAMEBRAISON, SNOKE               ACCOUNT NO.:  [id number]   MEDICAL RECORD NO.:  [id number]          PATIENT TYPE:  INP   LOCATION:  1518                         FACILITY:  [facility]   PHYSICIAN:  Antonietta Breach, M.D.  DATE OF BIRTH:  1985/11/21   DATE OF CONSULTATION:  12/29/2007  DATE OF DISCHARGE:                                 CONSULTATION   Travis [name] still had severe catastrophic anxiety yesterday with severe  muscle tension and required an increase in his Ativan with 2 mg dosages  x4 over the past 24 hours.  He now has a reduction of his anxiety from  approximately a 10 out of 0 to 10 down to a 6.   He is now confident that he can function outside the hospital. He does  not have any thoughts of harming himself or others.  He does have intact  future constructive goals as well as interests.  He is not having any  hallucinations or delusions.  He has no slurring his slurring of speech  or decreased energy.  He is able to attend well.  His memory and  orientation function are intact.   He is cooperative with staff.   The case was discussed with his attending physician   REVIEW OF SYSTEMS:  GASTROINTESTINAL:  No nausea or other adverse GI  effects from the Celexa.   PHYSICAL EXAMINATION:  VITAL SIGNS:  Temperature is 97.8, pulse 77,  respiratory rate 20, blood pressure 112/66, O2 saturation on room air is  98%.  It is noted that his pulse has decreased from the 107 rate on  December 8. He did have at least two checks of his pulse rate that day  that were over 100, and over the past 24 hours with the Ativan he has  not raised his pulse above 100.  In fact he has been between 70 and 90  beats per minute.   MENTAL STATUS EXAM:  Travis [name] is alert.  He has good eye contact.  His  attention span is normal. His concentration is within normal limits.  He  is able to smile some. Affect still mildly anxious mood still slightly  anxious.  He is oriented to all spheres.  His memory is  intact to  immediate, recent and remote.  His speech involves normal rate and  prosody without dysarthria.  Thought process is logical, coherent, goal-  directed.  No looseness of associations.  Thought content:  No thoughts  of harming himself.  No thoughts of harming others. No delusions, no  hallucinations.  Insight is intact. Judgment is intact.   ASSESSMENT:  AXIS I:  293.84 anxiety disorder, not otherwise specified.  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   Travis [name] is not at risk to harm himself or others.  He agrees to call  emergency services immediately for any thoughts of harming himself,  thoughts of harming others or distress.     Travis [name] is a agrees to not drive if drowsy.   The undersigned recommended that Travis [name] attend a chemical dependence  intensive outpatient program where he will be  able to address his  cannabis dependence as well as ongoing reassessment of his psychotropic  medication along with coping skills, stress management, and relapse  prevention skills.   RECOMMENDATIONS:  1. The undersigned recommends that the prescriber of the Ativan give      very short prescription amounts with only enough refill to get the      patient to the next appointment.  2. For the initial week at the hospital the undersigned recommends      that the patient not drive, given that he is requiring high doses      of Ativan and will be adjusting to the outpatient setting  3. Would increase his Celexa to 20 mg q.a.m. The long-term goal of the      SSRI dosing will be to increase it to 60 mg daily over time as      tolerated.  However, his discharge dosage can be 20 mg daily  4. After the chemical dependence intensive outpatient program the      patient will be an excellent candidate for cognitive behavioral      therapy combined with deep breathing and progressive muscle      relaxation.  5. It is recommended that after the chemical dependence intensive      outpatient  program that he see a psychiatrist as well as a      psychotherapist.  6. Also recommend 12-step groups and principles.  7. Would be cautious about a nausea with increase in the Celexa. Would      continue to be cautious about any sedation or imbalance with the      Ativan.  8. The Ativan outpatient dosing is recommended at Ativan 1 mg 1/2 to 2      p.o. t.i.d. p.r.n. anxiety and 1/2 to 2 p.o. q.h.s. p.r.n.      insomnia. Again would dispense no more than 20 tablets per      prescription at this time, providing enough to get him to the      chemical dependence intensive outpatient program through the first      three business days. 9. The prescription for the Celexa can be 20      mg 1 p.o. q.a.m. dispense #30.   The case was also discussed with nursing staff in preparation for  discharge.      Antonietta Breach, M.D.  Electronically Signed     JW/MEDQ  D:  12/29/2007  T:  12/29/2007  Job:  [id number]

## 2010-06-03 NOTE — Discharge Summary (Signed)
NAMESKYELAR, SWIGART               ACCOUNT NO.:  [id number]   MEDICAL RECORD NO.:  [id number]          PATIENT TYPE:  INP   LOCATION:  1518                         FACILITY:  [facility]   PHYSICIAN:  Monte Fantasia, MD  DATE OF BIRTH:  Nov 12, 1985   DATE OF ADMISSION:  12/26/2007  DATE OF DISCHARGE:                               DISCHARGE SUMMARY   PRIMARY CARE PHYSICIAN:  Unassigned.   DISCHARGE DIAGNOSES:  1. Anxiety disorder.  2. Assess Barrett's esophagus.   DISCHARGE MEDICATIONS:  1. Celexa 20 mg q. a.m.  2. Ativan 0.5 to 2 mg p.o. t.i.d. p.r.n. anxiety and 0.5 to 2 mg p.o.      nightly p.r.n. insomnia.  3. Protonix 40 mg p.o. q. 12.  4. Sucralfate 1 g p.o. q.i.d.   COURSE DURING HOSPITAL STAY:  This is a 25 year old Caucasian male  patient who was admitted with a day of discharge on December 26, 1998 to  1009 with complaints of increasing chest pain and shortness of breath.  The patient was admitted in view for possible stroke to rule out any PE  and the chest x-ray and CT angio both were negative for pneumonia or PE.  The patient was evaluated by Dr. Jeanie Sewer for his anxiety disorder and,  as per Dr. Providence Crosby evaluation the patient had increasing  exacerbation of the shortness of breath with hyperventilation to the  point of carpopedal spasm and facial paresthesias, as oral vascular  spasms.  The patient was started on Ativan 2 mg p.o. t.i.d. and  responded well to the medication.  As per Dr. Jeanie Sewer, the patient is  now stable to be discharged and can be discharged home with intensive  outpatient program.  In order to address a chemical dependence of  cannabis, and also the assessment of his psychotropic medications along  with coping skills, stress management, and relapse and prevention  skills.  Social worker on board in view of arranging the same.  Please  refer to the recommendations of Dr. Jeanie Sewer as dictated in the  consultation on December 29, 2007.  The  patient is at present medically  stable to be discharged and can be discharged home.   LABS ON DISCHARGE:  Total WBC 6.1, hemoglobin 13.4, hematocrit 38.  Platelet count 156,000.  Sodium 142, potassium 3.8, chloride 110, bicarb  25, BUN 4, creatinine 0.7 and cardiac enzymes x3 sets have been  negative.  TSH 0.665.  Blood cultures, urine cultures have been no  growth to date.   RADIOLOGICAL INVESTIGATIONS DONE SINCE ADMISSION:  Chest x-ray done on  December 26, 2007.  Impression:  No pneumonia with peribronchial  thickening and bronchitis.  CT angio done on December 26, 2007.  Impression:  No evidence of acute pulmonary thromboembolism.   ASSESSMENT AND PLAN:  Primary anxiety disorder.  We will discharge the  patient with intensive outpatient management on Celexa and Ativan.  The  patient is recommended Child psychotherapist on board for the same.  Will  discharge as soon as the outpatient management program is arranged for.      Monte Fantasia, MD  Electronically Signed     MP/MEDQ  D:  12/29/2007  T:  12/29/2007  Job:  [id number]

## 2010-06-03 NOTE — Consult Note (Signed)
Travis [name], FIFE               ACCOUNT NO.:  [id number]   MEDICAL RECORD NO.:  [id number]          PATIENT TYPE:  INP   LOCATION:  1433                         FACILITY:  [facility]   PHYSICIAN:  Antonietta Breach, M.D.  DATE OF BIRTH:  Jun 09, 1985   DATE OF CONSULTATION:  12/27/2007  DATE OF DISCHARGE:                                 CONSULTATION   REQUESTING PHYSICIAN:  [facility].   REASON FOR CONSULTATION:  Severe anxiety.   HISTORY OF PRESENT ILLNESS:  Mr. Travis [name] is a 25 year old male  admitted to the [facility] on December 26, 2007, with chest  pain and shortness of breath.   Travis [name] has the ongoing stress of asthma attacks.  He has been  experiencing anxiety for a long time, however, over the past 10 days,  his anxiety has exacerbated with repeated fears of worsening asthma  attack, feeling on edge, muscle tension, catastrophic thinking, and  hyperventilating to the point of carpopedal spasm, as well as perioral  paresthesias, as well as oral muscular spasm.  He also has received  Ativan 0.5 mg 3 times over the past 12 hours without relief.  He does  not have any thoughts of harming himself or others.  He has no  hallucinations or delusions.  His orientation and memory function are  intact.  He is not combative.  He is very cooperative with bedside care.   He has been crying due to the distress, however, he does not have any  suicidal thoughts.  He does have constructive interests and looks  forward to his job and other activities; however, he also is very  worried that he is going to lose his job if his sick leave lasts too  long.   [name] states that he hardly slept at all last night and was up most  of the night shaking with his anxiety.   PAST PSYCHIATRIC HISTORY:  Mr. Travis [name] describes a long-term history of  panic attacks and anxiety that is similar to that above.  He has severe  bouts.  At times he uses marijuana to treat them as a form of  self-  medication.   He denies any history of mania.  He has no history of suicide attempts.  He has never had any form of psychiatric treatment.  He has never any  psychiatric admissions.   FAMILY PSYCHIATRIC HISTORY:  None known.   SOCIAL HISTORY:  Mr. Travis [name] works at [employer].  He denies other illegal drugs.  He does not have any problems with alcohol.  He does smoke tobacco  regularly and points out that without the nicotine he feels more  anxious.  He lives with his family.   PAST MEDICAL HISTORY:  1. Asthma with a history of pneumothorax after coughing excessively a      number of years ago.  2. Gastroesophageal reflux disease.   MEDICATIONS:  The MAR is reviewed.  He currently is on Ativan 0.5 mg  every 6 hours p.r.n.  Please see the discussion above.   HE HAS NO KNOWN DRUG ALLERGIES.  LABORATORY DATA:  TSH is normal.  WBC 11.4, hemoglobin 17.7, platelet  count 196.  ANA negative.  Sodium 137, BUN is 11, creatinine 1.0,  glucose 130.  His SGOT 22, SGPT 19.  Phosphorus normal.  Magnesium  normal.   REVIEW OF SYSTEMS:  CONSTITUTIONAL, HEAD, EYES, EARS, NOSE, THROAT,  MOUTH NEUROLOGIC, PSYCHIATRIC, CARDIOVASCULAR, RESPIRATORY,  GASTROINTESTINAL, GENITOURINARY, SKIN, MUSCULOSKELETAL, HEMATOLOGIC,  LYMPHATIC, ENDOCRINE, METABOLIC:  All unremarkable.   EXAMINATION:  VITAL SIGNS:  Temperature 98.0.  Pulse 98.  Respiratory  rate is 20.  Blood pressure 103/54.  O2 saturation on room air 100%.  GENERAL APPEARANCE:  Mr. Travis [name] is a young male sitting up in his  hospital bed with no abnormal involuntary movements.   MENTAL STATUS EXAM:  Mr. Travis [name] is a young male appearing his chronologic  age with intermittent eye contact.  His attention span is slightly  decreased.  Concentration mildly decreased.  His affect is anxious.  Mood anxious.  He also has intermittent crying when discussing the  misery that he has been in.  He is oriented to all spheres.  His memory  is intact to  immediate, recent, and remote.  His fund of knowledge and  intelligence are within normal limits.  Speech is slightly pressured at  times.  There is normal prosody.  No dysarthria.  Thought process is  logical, coherent, goal directed.  No looseness of associations.  Thought content, no thoughts of harming himself.  No thoughts of harming  others no delusions, no hallucinations.  Insight is intact.  Judgment is  intact.   ASSESSMENT:  AXIS I:  1. 293.84, anxiety disorder, not otherwise specified.  There are      general medical factors involved.  He does have an element of his      anxiety that involves a conditioned response to respiratory      distress.  He does appear to have a component of generalized      anxiety disorder and possibly panic disorder.  2. Cannabis abuse.  AXIS II:  Deferred.  AXIS III:  See past medical history.  AXIS IV:  General, medical.  AXIS V:  55.   Mr. Travis [name] is not at risk to harm himself or others.  He does agree to  use emergency services immediately for any thoughts of harming himself,  thoughts of harming others, or distress.   The undersigned provided ego-supportive psychotherapy and education.   The undersigned discussed cognitive behavioral therapy, as well as deep  breathing and progressive muscle relaxation.  It was emphasized that  this form of psychotherapy and training will be at least as important as  medication.   The indications, alternatives, and adverse effects of the following were  discussed with the patient:  Ativan or other benzodiazepines for anti-  acute anxiety including the risk of dependence; Celexa for anti-anxiety  over the long term.  The patient understands above information and wants  to proceed as below.   The patient agrees to not drive if drowsy.   RECOMMENDATION:  1. Would start Celexa 10 mg p.o. q.a.m. and if tolerated would      increase to 20 mg p.o. q.a.m. in 4 days, would be cautious about      nausea or  loose stools as side effects from the Celexa.  2. Would increase Ativan to 0.5 to 2 mg p.o. IM or IV every 6 hours      p.r.n. anxiety or nocturnal insomnia.   PRELIMINARY DISCHARGE  PLANNING:  Would ask the social worker to set this  patient up with outpatient psychiatric medication management, as well as  psychotherapy involving cognitive behavioral therapy, deep breathing,  and progressive muscle relaxation.  Would make these appointments as  soon as possible.  Options for finding these therapists include the  patient's insurance [employer] Lachman panel, as well as clinics such as those  attached to [facility], [facility], or [employer].  There are a number of private psychiatrists and psychotherapists in the  private community as well.      Antonietta Breach, M.D.  Electronically Signed     JW/MEDQ  D:  12/27/2007  T:  12/27/2007  Job:  [id number]

## 2010-06-03 NOTE — H&P (Signed)
Travis [name], Travis [name]               ACCOUNT NO.:  [id number]   MEDICAL RECORD NO.:  [id number]          PATIENT TYPE:  INP   LOCATION:  0104                         FACILITY:  [facility]   PHYSICIAN:  Peggye Pitt, M.D. DATE OF BIRTH:  February 15, 1985   DATE OF ADMISSION:  12/26/2007  DATE OF DISCHARGE:                              HISTORY & PHYSICAL   PRIMARY CARE PHYSICIAN:  He does not have one, that makes him  unassigned.   CHIEF COMPLAINT:  Chest pain and shortness of breath.   Travis [name] is a pleasant 25 year old young Caucasian man who was just  discharged from the hospital yesterday, December 25, 2007, with acute  gastroenteritis, he had an EGD that showed reflux esophagitis and  Barrett's esophagus.  He returns today to the emergency department  complaining of substernal chest pain, nonradiating, constant since 10:30  p.m. the night before, as well as some shortness of breath with self  documented episodes of hyperventilation and passing out.  His father  who is present during this interview, was actually there during one of  these passing out episodes and states that his son really does not  have any loss of consciousness, instead what happens is he has some  jerking movements and closes his eyes, but he says he is still awake.  During these episodes he also has bilateral hand and bilateral feet  numbness and he says that he thinks he is having a panic attack.  For  this reason, he is brought into the emergency department for further  evaluation and management.   ALLERGIES:  NO KNOWN DRUG ALLERGIES.   PAST MEDICAL HISTORY:  1. Asthma.  2. GERD.   HOME MEDICATIONS:  1. Protonix 40 mg p.o. b.i.d.  2. Sucralfate 1 gram p.o. four times daily.  3. He was suppose to be on ciprofloxacin 500 mg p.o. b.i.d. for 5 more      days, as well as dicyclomine 20 mg four times daily.  He did not      fill out the above prescriptions after he was discharged from the      hospital  yesterday.   SOCIAL HISTORY:  He denies any alcohol use.  Says he quit smoking last  week while he was in the hospital.  Says he occasionally uses marijuana.   FAMILY HISTORY:  Noncontributory.   REVIEW OF SYSTEMS:  Negative except as already mentioned in the HPI.   PHYSICAL EXAMINATION:  VITAL SIGNS:  Upon admission, blood pressure  131/75, heart rate 118 which later dropped to 89, respirations 24.  O2  sats 99% on room air with a temperature of 98.2.  GENERAL:  He is alert, awake and oriented x3.  HEENT:  Normocephalic, atraumatic.  His pupils are equal and reactive to  light and accommodation with intact extraocular movements.  NECK:  Supple.  No JVD, no lymphadenopathy, no bruits, no goiter.  HEART:  Regular rate and rhythm with no murmurs, rubs or gallops.  LUNGS:  Clear to auscultation bilaterally.  ABDOMEN:  Soft, nontender, nondistended with positive bowel sounds.  EXTREMITIES:  No edema.  Positive pedal pulses.  NEUROLOGICAL:  Intact and nonfocal.   LABORATORY DATA:  Upon admission, sodium 139, potassium 4.0, chloride  105, bicarb 23, BUN 9, creatinine 1.1 with a glucose of 106.  WBCs 11.4,  hemoglobin of 17.7 and platelets of 196.  His MCV is 91.9.  His absolute  neutrophil count is elevated at 9.8.  His first set of point of care  markers is negative.  He had a chest x-ray that showed no pneumonia with  questionable bronchitis and an abdominal x-ray that showed  nonobstructive bowel gas pattern.  He also had a CT angiogram of the  chest which showed no evidence for a pulmonary embolus.   ASSESSMENT AND PLAN:  1. Chest pain and shortness of breath.  His chest x-ray and CT      angiogram showed no evidence of pneumonia, pulmonary embolus or an      aortic dissection.  We will rule out an acute coronary syndrome by      cycling EKGs and cardiac enzymes, although I doubt this diagnosis      in this young man.  He does not appear to have any wheezing which      would be  consistent with his history of asthma.  I think all of his      symptoms fit with an anxiety disorder/panic attack.  Will ask      psychiatry to see in the morning.  We will also check a urine drug      screen.  2. For his asthma, we will continue his albuterol inhaler.  3. For his gastroesophageal reflux disease and reflux esophagitis, we      will continue with his twice daily proton pump inhibitors.  4. For prophylaxis while in the hospital, he will be on Lovenox for      deep venous thrombosis prophylaxis and Protonix for      gastrointestinal prophylaxis.      Peggye Pitt, M.D.  Electronically Signed     EH/MEDQ  D:  12/26/2007  T:  12/26/2007  Job:  [id number]

## 2010-06-06 NOTE — H&P (Signed)
Travis [name], Travis [name]               ACCOUNT NO.:  [id number]   MEDICAL RECORD NO.:  [id number]          PATIENT TYPE:  INP   LOCATION:  2002                         FACILITY:  [facility]   PHYSICIAN:  Salvatore Decent. [name], M.D. DATE OF BIRTH:  02-Dec-1985   DATE OF ADMISSION:  07/20/2005  DATE OF DISCHARGE:                                HISTORY & PHYSICAL   CHIEF COMPLAINT:  Spontaneous left pneumothorax.   HISTORY OF PRESENT ILLNESS:  This is a 25 year old Caucasian male with past  medical history of asthma and tobacco abuse.  The patient has been  complaining of left-sided chest pain since Saturday afternoon, July 18, 2005.  The patient came to the ER last evening for difficulty breathing.  The patient had a chest x-ray which showed a 10-15% left pneumothorax.  The  patient complains of pain being sharp.  The pain is present when he is  moving, on inspiration, upon expiration.  The pain radiates up his neck into  his left shoulder.  The patient complains of pain a 9/10 on a scale of 1-10,  10 being the worst.  The patient does complain of shortness of breath.  He  is unable to catch his breath.  He does complain of coughing since Saturday.  The coughing starts when he is unable and then he is having difficulty  breathing.  The patient does state that his pain has increased since  Saturday.  The patient does have an increase in his shortness of breath  since Saturday.  The patient was seen by Dr. [name] in the ER.  His  pneumothorax was stable on two sequential films.  The patient was sent to  the CVTS office for followup and repeat chest x-ray which showed a slight  increase of his left pneumothorax.   The patient denies any sputum production, hemoptysis, PND, orthopnea, fever,  chills, unexplained weight loss, reflux symptoms, history of PNA, history of  DVT, history of PE, peripheral edema, angina, palpitations, any arrhythmias,  TIA/CVA symptoms.  It is best thought that the patient  undergo a chest tube  placement by Dr. [name] and the patient has agreed to proceed.   PAST MEDICAL HISTORY:  1.  Asthma.  2.  Tobacco abuse.   PAST SURGICAL HISTORY:  None.   ALLERGIES:  NO KNOWN DRUG ALLERGIES.   MEDICATIONS:  Albuterol MDI two puffs b.i.d. and p.r.n. wheezing.   REVIEW OF SYSTEMS:  See HPI for positives and negatives, otherwise  unremarkable.  The patient has no diabetes mellitus or thyroid problems.  The patient has never had a pneumothorax before.   SOCIAL HISTORY:  The patient is single and lives with his older brother in  [place].  The patient is employed and does still drive.  The patient  occasionally uses alcohol.  He smokes one pack of cigarettes a day for the  past 3 years.   FAMILY HISTORY:  The patient has a positive family history in father's  grandmother of CVA and MI.  Negative family history in the mother and father  with hypertension, coronary artery disease.  PHYSICAL EXAMINATION:  VITALS:  Vital signs stable.  The patient is  afebrile.  GENERAL:  This is a 25 year old Caucasian male in non-acute distress.  HEAD/EYES/EARS/NOSE AND THROAT:  Normocephalic, atraumatic.  Pupils equal,  round, react to light and accommodation.  Extraocular movements intact.  Oral mucosa pink and moist.  Sclerae nonicteric.  NECK:  Supple.  RESPIRATORY:  Unlabored.  Diminished breath sounds in the left side.  CARDIAC:  Regular rate and rhythm.  ABDOMEN:  Soft, nontender, nondistended.  Normoactive bowel sounds x4.  GU/RECTAL:  Deferred.  EXTREMITIES:  No edema.  Lower extremity temperature warm.  Radial, femoral,  popliteal, dorsalis pedis and posterior tibial pulses 2+ bilaterally and  throughout.  NEUROLOGIC:  Nonfocal.  Alert and oriented x4.  Gait steady.  Muscle  strength 5+ bilaterally and throughout.  Cranial nerves 2-12 are intact.  Deep tendon reflexes 2+ and symmetrical.   ASSESSMENT:  Left spontaneous pneumothorax.   PLAN:  1.  Admit patient to  [facility] on July 20, 2005 under Dr. Orvan July      service.  2.  Chest tube placement per Dr. [name].  3.  See admit orders.  4.  Risks and benefits were explained to the patient.  Dr. [name] has seen and      evaluated the patient prior to admission and has agreed with the above.      Constance Holster, PA      Salvatore Decent. [name], M.D.  Electronically Signed    JMW/MEDQ  D:  07/20/2005  T:  07/20/2005  Job:  [id number]

## 2010-06-06 NOTE — Discharge Summary (Signed)
Travis [name], Travis [name]               ACCOUNT NO.:  [id number]   MEDICAL RECORD NO.:  [id number]          PATIENT TYPE:  INP   LOCATION:  2002                         FACILITY:  [facility]   PHYSICIAN:  Salvatore Decent. [name], M.D. DATE OF BIRTH:  November 22, 1985   DATE OF ADMISSION:  07/20/2005  DATE OF DISCHARGE:                                 DISCHARGE SUMMARY   DISCHARGE DATE:  Tentatively 2-4 days.   ADMISSION DIAGNOSIS:  Spontaneous left pneumothorax.   DISCHARGE DIAGNOSES:  1.  Spontaneous left pneumothorax status post chest tube placement.  2.  Asthma.   CONSULTS:  None.   PROCEDURES:  On July 20, 2005 the patient underwent a left chest tube  placement #20S using sterile technique by Dr. Tressie Stalker.   HISTORY OF PRESENT ILLNESS:  This is a 25 year old white male with a history  of asthma and tobacco abuse presenting with sudden onset of left-sided chest  pain beginning in the afternoon on July 18, 2005.  The patient was seen by  the ER physician on July 19, 2005 with chest x-ray revealing a 10% to 15%  left pneumothorax, which was stable on two sequential films.  The patient  was sent to [facility] office on July 20, 2005 for a followup repeat chest x-ray,  which showed a slight increase in size of the pneumothorax.  The patient did  complain of increase in pain and shortness of breath on July 18, 2005.  It  was best thought that the patient be admitted to [facility] and  undergo a chest tube placement.  The risks and benefits were explained to  the patient.  He agreed to continue.   HOSPITAL COURSE:  A chest tube was placed July 20, 2005 under local  anesthesia, Versed and morphine.  The patient tolerated the procedure well.  Followup chest x-ray was stable.  On July 21, 2005 the patient was  complaining of a lot of pain.  He was using morphine every 30 minutes  without good relief.  The patient was started on a morphine PCA and a  Toradol injection to see if this gives him better  pain control.  The  patient's chest x-ray did not show any pneumothorax and it was stable.  The  patient did not have an air leak and he did not have any chest tube  drainage.  His vital signs are stable.  He remained afebrile.  The patient's  sats were 100% on room air.  He was hemodynamically stable throughout his  stay.  The patient's chest tube was placed on water seal July 21, 2005.  If  his chest x-ray in the morning is stable, his chest tube will be  discontinued.  Provided that patient's chest pain remains stable after the  chest tube is taken out, he will be discharged home.   PHYSICAL EXAMINATION:  VITAL SIGNS:  Afebrile.  Vital signs stable.  HEART:  Regular rate and rhythm.  LUNGS:  Clear to auscultation bilaterally.  CHEST:  Chest tube shows no air leak and no drainage.   LABORATORY DATA:  A CBC  showed a white blood cell count of 11.2, hemoglobin  14.9, hematocrit 42.7 and a platelet count of 154.   DISCHARGE CONDITION:  The patient will be discharged home in good condition.   MEDICATIONS:  Include:  1.  Oxycodone 5 mg 1-2 tabs q.4 h. p.r.n.  2.  Albuterol inhaler q.i.d. p.r.n.   INSTRUCTIONS:  The patient is instructed to follow a regular diet.  No  driving or heavy lifting greater than 10 pounds for three weeks.  The  patient is to ambulate 3-4 times daily and increase activity as tolerated.  The patient is to continue his breathing exercises.  He may shower and clean  his wounds with mild soap and water.  The patient is to call the office with  any problems such as incision drainage, redness, temp greater than 101.5.  The patient is to call the office if he experiences any further chest pain  or shortness of breath.   FOLLOWUP:  The patient will have a follow-up appointment with Dr. [name] on  August 03, 2005 at 2:45.  Prior to seeing Dr. [name], he will have a chest x-ray  taken at [facility] at 1:45.      Constance Holster, PA      Salvatore Decent. [name],  M.D.  Electronically Signed    JMW/MEDQ  D:  07/21/2005  T:  07/21/2005  Job:  [id number]

## 2010-06-17 ENCOUNTER — Ambulatory Visit (HOSPITAL_COMMUNITY): Admission: RE | Admit: 2010-06-17 | Payer: Self-pay | Source: Ambulatory Visit | Admitting: Surgery

## 2010-08-14 ENCOUNTER — Inpatient Hospital Stay (INDEPENDENT_AMBULATORY_CARE_PROVIDER_SITE_OTHER)
Admission: RE | Admit: 2010-08-14 | Discharge: 2010-08-14 | Disposition: A | Discharge status: Home / Self Care | Payer: BC Managed Care – PPO | Source: Ambulatory Visit | Attending: Family Medicine | Admitting: Family Medicine

## 2010-08-14 DIAGNOSIS — L0291 Cutaneous abscess, unspecified: Secondary | ICD-10-CM

## 2010-09-16 ENCOUNTER — Telehealth: Payer: Self-pay | Admitting: Family Medicine

## 2010-09-16 NOTE — Telephone Encounter (Signed)
Message on triage line. Pt states Dr. Clent Ridges gave him some scalp shampoo that works really well. However, after pt's insurance, it's still $30. Wondering if he can get a diff RX shampoo that is cheaper, or samples? Please call him.

## 2010-09-17 NOTE — Telephone Encounter (Signed)
Left voice message for pt to call back if he would like script called in.

## 2010-09-17 NOTE — Telephone Encounter (Signed)
No there is nothing like it that is generic, and we never have samples. If he needs refills, call in a one year supply

## 2010-09-23 ENCOUNTER — Other Ambulatory Visit: Payer: Self-pay | Admitting: Family Medicine

## 2010-09-23 ENCOUNTER — Telehealth: Payer: Self-pay | Admitting: Internal Medicine

## 2010-09-23 NOTE — Telephone Encounter (Signed)
Left a message for patient to call me. 

## 2010-09-24 NOTE — Telephone Encounter (Signed)
Left a message for patient to call me again. 

## 2010-09-25 NOTE — Telephone Encounter (Signed)
Left a message for patient to call me. 

## 2010-10-21 ENCOUNTER — Telehealth: Payer: Self-pay | Admitting: *Deleted

## 2010-10-21 LAB — URINALYSIS, ROUTINE W REFLEX MICROSCOPIC
Nitrite: NEGATIVE
Protein, ur: 30 mg/dL — AB
Specific Gravity, Urine: 1.031 — ABNORMAL HIGH (ref 1.005–1.030)
Urobilinogen, UA: 1 mg/dL (ref 0.0–1.0)

## 2010-10-21 LAB — COMPREHENSIVE METABOLIC PANEL
Albumin: 5.2 g/dL (ref 3.5–5.2)
Alkaline Phosphatase: 78 U/L (ref 39–117)
CO2: 19 mEq/L (ref 19–32)
Calcium: 10.1 mg/dL (ref 8.4–10.5)
GFR calc non Af Amer: >60 mL/min (ref >60)
Glucose, Bld: 149 mg/dL — ABNORMAL HIGH (ref 70–99)
Potassium: 4.1 mEq/L (ref 3.5–5.1)
Total Protein: 7.7 g/dL (ref 6.0–8.3)

## 2010-10-21 LAB — DIFFERENTIAL
Basophils Absolute: 0.1 10*3/uL (ref 0.0–0.1)
Basophils Relative: 0 % (ref 0–1)
Lymphocytes Relative: 5 % — ABNORMAL LOW (ref 12–46)
Lymphs Abs: 0.7 10*3/uL (ref 0.7–4.0)
Monocytes Relative: 11 % (ref 3–12)
Neutro Abs: 13.1 10*3/uL — ABNORMAL HIGH (ref 1.7–7.7)
Neutrophils Relative %: 84 % — ABNORMAL HIGH (ref 43–77)

## 2010-10-21 LAB — HEMOGLOBIN AND HEMATOCRIT, BLOOD
HCT: 43.4 % (ref 39.0–52.0)
HCT: 46.3 % (ref 39.0–52.0)
Hemoglobin: 15.8 g/dL (ref 13.0–17.0)

## 2010-10-21 LAB — URINE MICROSCOPIC-ADD ON

## 2010-10-21 LAB — CBC
HCT: 54 % — ABNORMAL HIGH (ref 39.0–52.0)
Hemoglobin: 18.6 g/dL — ABNORMAL HIGH (ref 13.0–17.0)
Platelets: 172 10*3/uL (ref 150–400)

## 2010-10-21 LAB — LIPASE, BLOOD: Lipase: 24 U/L (ref 11–59)

## 2010-10-21 LAB — APTT: aPTT: 25 seconds (ref 24–37)

## 2010-10-21 LAB — FECAL LACTOFERRIN, QUANT

## 2010-10-21 LAB — URINE CULTURE

## 2010-10-21 LAB — PROTIME-INR
INR: 1.1 (ref 0.00–1.49)
Prothrombin Time: 14.5 seconds (ref 11.6–15.2)

## 2010-10-21 NOTE — Telephone Encounter (Signed)
No, there is nothing like this that I know of which is cheaper

## 2010-10-21 NOTE — Telephone Encounter (Signed)
Pt. States Dr Clent Ridges gave him a shampoo for dry scalp, and it worked really well, but is too expensive.  Wants to know if there is anything else cheaper he could use???

## 2010-10-22 NOTE — Telephone Encounter (Signed)
Left message on pt's voice-mail.

## 2010-10-24 LAB — CARDIAC PANEL(CRET KIN+CKTOT+MB+TROPI)
CK, MB: 0.7 ng/mL (ref 0.3–4.0)
CK, MB: 0.9 ng/mL (ref 0.3–4.0)
Relative Index: INVALID (ref 0.0–2.5)
Total CK: 68 U/L (ref 7–232)
Troponin I: <0.01 ng/mL (ref 0.00–0.06)
Troponin I: <0.01 ng/mL (ref 0.00–0.06)

## 2010-10-24 LAB — POCT I-STAT, CHEM 8
BUN: 9 mg/dL (ref 6–23)
Calcium, Ion: 1.1 mmol/L — ABNORMAL LOW (ref 1.12–1.32)
Creatinine, Ser: 1.1 mg/dL (ref 0.4–1.5)
TCO2: 23 mmol/L (ref 0–100)

## 2010-10-24 LAB — CBC
HCT: 41.8 % (ref 39.0–52.0)
HCT: 42 % (ref 39.0–52.0)
HCT: 45.8 % (ref 39.0–52.0)
HCT: 51.9 % (ref 39.0–52.0)
HCT: 53.4 % — ABNORMAL HIGH (ref 39.0–52.0)
Hemoglobin: 14.2 g/dL (ref 13.0–17.0)
Hemoglobin: 14.5 g/dL (ref 13.0–17.0)
Hemoglobin: 14.5 g/dL (ref 13.0–17.0)
Hemoglobin: 15.7 g/dL (ref 13.0–17.0)
Hemoglobin: 16.1 g/dL (ref 13.0–17.0)
Hemoglobin: 17.7 g/dL — ABNORMAL HIGH (ref 13.0–17.0)
Hemoglobin: 17.8 g/dL — ABNORMAL HIGH (ref 13.0–17.0)
MCHC: 34.8 g/dL (ref 30.0–36.0)
MCHC: 35.1 g/dL (ref 30.0–36.0)
MCHC: 35.4 g/dL (ref 30.0–36.0)
MCV: 90.5 fL (ref 78.0–100.0)
MCV: 91.9 fL (ref 78.0–100.0)
MCV: 91.9 fL (ref 78.0–100.0)
MCV: 92.2 fL (ref 78.0–100.0)
MCV: 92.9 fL (ref 78.0–100.0)
MCV: 93.5 fL (ref 78.0–100.0)
MCV: 93.7 fL (ref 78.0–100.0)
Platelets: 156 10*3/uL (ref 150–400)
RBC: 4.44 MIL/uL (ref 4.22–5.81)
RBC: 5.7 MIL/uL (ref 4.22–5.81)
RDW: 11.3 % — ABNORMAL LOW (ref 11.5–15.5)
RDW: 12.1 % (ref 11.5–15.5)
RDW: 12.2 % (ref 11.5–15.5)
RDW: 12.4 % (ref 11.5–15.5)
RDW: 12.4 % (ref 11.5–15.5)
WBC: 10.7 10*3/uL — ABNORMAL HIGH (ref 4.0–10.5)
WBC: 5.5 10*3/uL (ref 4.0–10.5)
WBC: 6.1 10*3/uL (ref 4.0–10.5)

## 2010-10-24 LAB — POCT CARDIAC MARKERS
CKMB, poc: <1 ng/mL — ABNORMAL LOW (ref 1.0–8.0)
Myoglobin, poc: 31.3 ng/mL (ref 12–200)

## 2010-10-24 LAB — BASIC METABOLIC PANEL
BUN: 4 mg/dL — ABNORMAL LOW (ref 6–23)
CO2: 30 mEq/L (ref 19–32)
Calcium: 8.7 mg/dL (ref 8.4–10.5)
Chloride: 110 mEq/L (ref 96–112)
Creatinine, Ser: 0.77 mg/dL (ref 0.4–1.5)
Creatinine, Ser: 0.96 mg/dL (ref 0.4–1.5)
GFR calc non Af Amer: >60 mL/min (ref >60)
Glucose, Bld: 106 mg/dL — ABNORMAL HIGH (ref 70–99)
Potassium: 3.6 mEq/L (ref 3.5–5.1)
Sodium: 137 mEq/L (ref 135–145)
Sodium: 140 mEq/L (ref 135–145)

## 2010-10-24 LAB — COMPREHENSIVE METABOLIC PANEL
AST: 22 U/L (ref 0–37)
Albumin: 4.5 g/dL (ref 3.5–5.2)
Alkaline Phosphatase: 44 U/L (ref 39–117)
BUN: 5 mg/dL — ABNORMAL LOW (ref 6–23)
CO2: 26 mEq/L (ref 19–32)
Calcium: 9.6 mg/dL (ref 8.4–10.5)
Chloride: 105 mEq/L (ref 96–112)
Creatinine, Ser: 1 mg/dL (ref 0.4–1.5)
GFR calc Af Amer: >60 mL/min (ref >60)
GFR calc non Af Amer: >60 mL/min (ref >60)
Glucose, Bld: 106 mg/dL — ABNORMAL HIGH (ref 70–99)
Potassium: 3.4 mEq/L — ABNORMAL LOW (ref 3.5–5.1)
Sodium: 137 mEq/L (ref 135–145)
Total Bilirubin: 0.8 mg/dL (ref 0.3–1.2)
Total Protein: 5.3 g/dL — ABNORMAL LOW (ref 6.0–8.3)
Total Protein: 7 g/dL (ref 6.0–8.3)

## 2010-10-24 LAB — DIFFERENTIAL
Basophils Absolute: 0 10*3/uL (ref 0.0–0.1)
Eosinophils Relative: 1 % (ref 0–5)
Eosinophils Relative: 2 % (ref 0–5)
Lymphocytes Relative: 15 % (ref 12–46)
Lymphocytes Relative: 8 % — ABNORMAL LOW (ref 12–46)
Lymphs Abs: 0.9 10*3/uL (ref 0.7–4.0)
Lymphs Abs: 1.6 10*3/uL (ref 0.7–4.0)
Monocytes Absolute: 0.5 10*3/uL (ref 0.1–1.0)
Monocytes Relative: 14 % — ABNORMAL HIGH (ref 3–12)
Monocytes Relative: 6 % (ref 3–12)
Neutro Abs: 3.4 10*3/uL (ref 1.7–7.7)

## 2010-10-24 LAB — URINE CULTURE

## 2010-10-24 LAB — LIPASE, BLOOD: Lipase: 17 U/L (ref 11–59)

## 2010-10-24 LAB — PHOSPHORUS: Phosphorus: 2.9 mg/dL (ref 2.3–4.6)

## 2010-10-24 LAB — CLOSTRIDIUM DIFFICILE EIA: C difficile Toxins A+B, EIA: NEGATIVE

## 2010-10-24 LAB — MAGNESIUM
Magnesium: 1.9 mg/dL (ref 1.5–2.5)
Magnesium: 2.2 mg/dL (ref 1.5–2.5)

## 2010-10-24 LAB — ANA: Anti Nuclear Antibody(ANA): NEGATIVE

## 2010-12-01 ENCOUNTER — Telehealth: Payer: Self-pay | Admitting: Family Medicine

## 2010-12-01 MED ORDER — ALBUTEROL SULFATE HFA 108 (90 BASE) MCG/ACT IN AERS: 2.0000 | INHALATION_SPRAY | Freq: Four times a day (QID) | RESPIRATORY_TRACT | Status: DC | PRN

## 2010-12-01 NOTE — Telephone Encounter (Signed)
Pt called and requested a refill for Albuterol and send to [facility]. I sent script e-scribe and pt aware.

## 2010-12-08 ENCOUNTER — Telehealth: Payer: Self-pay | Admitting: *Deleted

## 2010-12-08 NOTE — Telephone Encounter (Signed)
As requested, records faxed to [facility] [place]

## 2011-02-13 ENCOUNTER — Emergency Department (HOSPITAL_COMMUNITY)
Admission: EM | Admit: 2011-02-13 | Discharge: 2011-02-13 | Disposition: A | Discharge status: Home / Self Care | Payer: BC Managed Care – PPO | Source: Home / Self Care | Attending: Emergency Medicine | Admitting: Emergency Medicine

## 2011-02-13 ENCOUNTER — Encounter (HOSPITAL_COMMUNITY): Payer: Self-pay

## 2011-02-13 DIAGNOSIS — J329 Chronic sinusitis, unspecified: Secondary | ICD-10-CM

## 2011-02-13 HISTORY — DX: Anxiety disorder, unspecified: F41.9

## 2011-02-13 MED ORDER — AMOXICILLIN-POT CLAVULANATE 875-125 MG PO TABS: 1.0000 | ORAL_TABLET | Freq: Two times a day (BID) | ORAL | Status: AC

## 2011-02-13 NOTE — ED Notes (Signed)
C/o sinus and nasal congestion, green nasal secretions for 5 days.

## 2011-02-13 NOTE — ED Provider Notes (Signed)
Dx sinusitis. Home on augmentin.  Medical screening examination/treatment/procedure(s) were performed by non-physician practitioner and as supervising physician I was immediately available for consultation/collaboration.  Luiz Blare MD   Luiz Blare, MD 02/13/11 [phone]

## 2011-02-13 NOTE — ED Provider Notes (Signed)
History     CSN: [id number]  Arrival date & time 02/13/11  7829   First MD Initiated Contact with Patient 02/13/11 1729      Chief Complaint  Patient presents with  . Sinusitis    (Consider location/radiation/quality/duration/timing/severity/associated sxs/prior treatment) Patient is a 26 y.o. male presenting with sinusitis. The history is provided by the patient. No language interpreter was used.  Sinusitis  This is a new problem. The current episode started more than 1 week ago. The problem has not changed since onset.There has been no fever. The fever has been present for less than 1 day. The pain is at a severity of 3/10. The pain is mild. The pain has been constant since onset. Associated symptoms include sinus pressure, sore throat, cough and shortness of breath. He has tried acetaminophen for the symptoms. The treatment provided no relief.    No past medical history on file.  No past surgical history on file.  No family history on file.  History  Substance Use Topics  . Smoking status: Not on file  . Smokeless tobacco: Not on file  . Alcohol Use: Not on file      Review of Systems  HENT: Positive for sore throat and sinus pressure.   Respiratory: Positive for cough and shortness of breath.   All other systems reviewed and are negative.    Allergies  Review of patient's allergies indicates no known allergies.  Home Medications   Current Outpatient Rx  Name Route Sig Dispense Refill  . ALBUTEROL SULFATE HFA 108 (90 BASE) MCG/ACT IN AERS Inhalation Inhale 2 puffs into the lungs every 6 (six) hours as needed. 6.7 g 3  . OMEPRAZOLE 40 MG PO CPDR  take 1 capsule by mouth once daily 30 capsule 10    BP 135/71  Pulse 89  Temp(Src) 97.2 F (36.2 C) (Oral)  Resp 20  SpO2 97%  Physical Exam  Nursing note and vitals reviewed. Constitutional: He appears well-developed and well-nourished.  HENT:  Head: Normocephalic.  Right Ear: External ear normal.  Left  Ear: External ear normal.  Mouth/Throat: Oropharynx is clear and moist.       Tender bilat max sinuses  Eyes: Conjunctivae and EOM are normal. Pupils are equal, round, and reactive to light.  Neck: Normal range of motion. Neck supple.  Cardiovascular: Normal rate and normal heart sounds.   Pulmonary/Chest: Effort normal.  Abdominal: Soft.  Musculoskeletal: Normal range of motion.  Neurological: He is alert.  Skin: Skin is warm.  Psychiatric: He has a normal mood and affect.    ED Course  Procedures (including critical care time)  Labs Reviewed - No data to display No results found.   No diagnosis found.    MDM  Pt counseled on sinus infections and treatment        Langston Masker, [place] 02/13/11 1751  Langston Masker, [place] 02/13/11 1753

## 2011-03-31 ENCOUNTER — Encounter (HOSPITAL_COMMUNITY): Payer: Self-pay

## 2011-03-31 ENCOUNTER — Emergency Department (HOSPITAL_COMMUNITY)
Admission: EM | Admit: 2011-03-31 | Discharge: 2011-03-31 | Disposition: A | Discharge status: Home / Self Care | Payer: BC Managed Care – PPO | Attending: Emergency Medicine | Admitting: Emergency Medicine

## 2011-03-31 DIAGNOSIS — R197 Diarrhea, unspecified: Secondary | ICD-10-CM | POA: Insufficient documentation

## 2011-03-31 DIAGNOSIS — IMO0001 Reserved for inherently not codable concepts without codable children: Secondary | ICD-10-CM | POA: Insufficient documentation

## 2011-03-31 DIAGNOSIS — R112 Nausea with vomiting, unspecified: Secondary | ICD-10-CM | POA: Insufficient documentation

## 2011-03-31 MED ORDER — SODIUM CHLORIDE 0.9 % IV BOLUS (SEPSIS): 1000.0000 mL | Freq: Once | INTRAVENOUS | Status: AC

## 2011-03-31 MED ORDER — SODIUM CHLORIDE 0.9 % IV BOLUS (SEPSIS)
1000.0000 mL | Freq: Once | INTRAVENOUS | Status: AC
  Administered 2011-03-31: 1000 mL via INTRAVENOUS

## 2011-03-31 MED ORDER — PROMETHAZINE HCL 25 MG/ML IJ SOLN
12.5000 mg | Freq: Once | INTRAMUSCULAR | Status: AC
  Administered 2011-03-31: 12.5 mg via INTRAVENOUS
  Filled 2011-03-31: qty 1

## 2011-03-31 MED ORDER — ONDANSETRON HCL 4 MG/2ML IJ SOLN
4.0000 mg | Freq: Once | INTRAMUSCULAR | Status: AC
  Administered 2011-03-31: 4 mg via INTRAVENOUS
  Filled 2011-03-31: qty 2

## 2011-03-31 MED ORDER — ONDANSETRON HCL 4 MG/2ML IJ SOLN
INTRAMUSCULAR | Status: AC
  Administered 2011-03-31: 4 mg
  Filled 2011-03-31: qty 2

## 2011-03-31 MED ORDER — ONDANSETRON HCL 4 MG/2ML IJ SOLN
4.0000 mg | Freq: Once | INTRAMUSCULAR | Status: AC
Start: 2011-03-31 — End: 2011-03-31

## 2011-03-31 MED ORDER — PROMETHAZINE HCL 25 MG RE SUPP: 25.0000 mg | Freq: Four times a day (QID) | RECTAL | Status: DC | PRN

## 2011-03-31 NOTE — ED Notes (Signed)
Patient reports that he developed nausea, vomiting and diarrhea last Saturday that has persisted daily. Reports weakness with same. Unable to keep liquids down.

## 2011-03-31 NOTE — Discharge Instructions (Signed)
YOUR CAN BE DISCHARGED HOME AND SHOULD FOLLOW UP WITH YOUR DOCTOR IN 2-3 DAYS FOR RECHECK. USE PHENERGAN TO CONTROL VOMITING. B.R.A.T. DIET FOR THE NEXT 12 HOURS THEN ADVANCE AS TOLERATED. RETURN HERE WITH ANY HIGH FEVER, SEVERE PAIN, BLOODY VOMITING OR BOWEL MOVEMENTS, OR OTHER WORSENING SYMPTOM.   B.R.A.T. Diet Your doctor has recommended the B.R.A.T. diet for you or your child until the condition improves. This is often used to help control diarrhea and vomiting symptoms. If you or your child can tolerate clear liquids, you may have:  Bananas.   Rice.   Applesauce.   Toast (and other simple starches such as crackers, potatoes, noodles).  Be sure to avoid dairy products, meats, and fatty foods until symptoms are better. Fruit juices such as apple, grape, and prune juice can make diarrhea worse. Avoid these. Continue this diet for 2 days or as instructed by your caregiver. Document Released: 01/05/2005 Document Revised: 12/25/2010 Document Reviewed: 06/24/2006 [facility] Patient Information 2012 [place], [place].  Diet for Diarrhea, Adult Having frequent, runny stools (diarrhea) has many causes. Diarrhea may be caused or worsened by food or drink. Diarrhea may be relieved by changing your diet. IF YOU ARE NOT TOLERATING SOLID FOODS:  Drink enough water and fluids to keep your urine clear or pale yellow.   Avoid sugary drinks and sodas as well as milk-based beverages.   Avoid beverages containing caffeine and alcohol.   You may try rehydrating beverages. You can make your own by following this recipe:    tsp table salt.    tsp baking soda.   ? tsp salt substitute (potassium chloride).   1 tbs + 1 tsp sugar.   1 qt water.  As your stools become more solid, you can start eating solid foods. Add foods one at a time. If a certain food causes your diarrhea to get worse, avoid that food and try other foods. A low fiber, low-fat, and lactose-free diet is recommended. Small, frequent  meals may be better tolerated.  Starches  Allowed:  White, [place], and pita breads, plain rolls, buns, bagels. Plain muffins, matzo. Soda, saltine, or graham crackers. Pretzels, melba toast, zwieback. Cooked cereals made with water: cornmeal, farina, cream cereals. Dry cereals: refined corn, wheat, rice. Potatoes prepared any way without skins, refined macaroni, spaghetti, noodles, refined rice.   Avoid:  Bread, rolls, or crackers made with whole wheat, multi-grains, rye, bran seeds, nuts, or coconut. Corn tortillas or taco shells. Cereals containing whole grains, multi-grains, bran, coconut, nuts, or raisins. Cooked or dry oatmeal. Coarse wheat cereals, granola. Cereals advertised as "high-fiber." Potato skins. Whole grain pasta, wild or brown rice. Popcorn. Sweet potatoes/yams. Sweet rolls, doughnuts, waffles, pancakes, sweet breads.  Vegetables  Allowed: Strained tomato and vegetable juices. Most well-cooked and canned vegetables without seeds. Fresh: Tender lettuce, cucumber without the skin, cabbage, spinach, bean sprouts.   Avoid: Fresh, cooked, or canned: Artichokes, baked beans, beet greens, broccoli, Brussels sprouts, corn, kale, legumes, peas, sweet potatoes. Cooked: Green or red cabbage, spinach. Avoid large servings of any vegetables, because vegetables shrink when cooked, and they contain more fiber per serving than fresh vegetables.  Fruit  Allowed: All fruit juices except prune juice. Cooked or canned: Apricots, applesauce, cantaloupe, cherries, fruit cocktail, grapefruit, grapes, kiwi, mandarin oranges, peaches, pears, plums, watermelon. Fresh: Apples without skin, ripe banana, grapes, cantaloupe, cherries, grapefruit, peaches, oranges, plums. Keep servings limited to  cup or 1 piece.   Avoid: Fresh: Apple with skin, apricots, mango, pears, raspberries, strawberries. Prune  juice, stewed or dried prunes. Dried fruits, raisins, dates. Large servings of all fresh fruits.  Meat and Meat  Substitutes  Allowed: Ground or well-cooked tender beef, ham, veal, lamb, pork, or poultry. Eggs, plain cheese. Fish, oysters, shrimp, lobster, other seafoods. Liver, organ meats.   Avoid: Tough, fibrous meats with gristle. Peanut butter, smooth or chunky. Cheese, nuts, seeds, legumes, dried peas, beans, lentils.  Milk  Allowed: Yogurt, lactose-free milk, kefir, drinkable yogurt, buttermilk, soy milk.   Avoid: Milk, chocolate milk, beverages made with milk, such as milk shakes.  Soups  Allowed: Bouillon, broth, or soups made from allowed foods. Any strained soup.   Avoid: Soups made from vegetables that are not allowed, cream or milk-based soups.  Desserts and Sweets  Allowed: Sugar-free gelatin, sugar-free frozen ice pops made without sugar alcohol.   Avoid: Plain cakes and cookies, pie made with allowed fruit, pudding, custard, cream pie. Gelatin, fruit, ice, sherbet, frozen ice pops. Ice cream, ice milk without nuts. Plain hard candy, honey, jelly, molasses, syrup, sugar, chocolate syrup, gumdrops, marshmallows.  Fats and Oils  Allowed: Avoid any fats and oils.   Avoid: Seeds, nuts, olives, avocados. Margarine, butter, cream, mayonnaise, salad oils, plain salad dressings made from allowed foods. Plain gravy, crisp bacon without rind.  Beverages  Allowed: Water, decaffeinated teas, oral rehydration solutions, sugar-free beverages.   Avoid: Fruit juices, caffeinated beverages (coffee, tea, soda or pop), alcohol, sports drinks, or lemon-lime soda or pop.  Condiments  Allowed: Ketchup, mustard, horseradish, vinegar, cream sauce, cheese sauce, cocoa powder. Spices in moderation: allspice, basil, bay leaves, celery powder or leaves, cinnamon, cumin powder, curry powder, ginger, mace, marjoram, onion or garlic powder, oregano, paprika, parsley flakes, ground pepper, rosemary, sage, savory, tarragon, thyme, turmeric.   Avoid: Coconut, honey.  Weight Monitoring: Weigh yourself every day.  You should weigh yourself in the morning after you urinate and before you eat breakfast. Wear the same amount of clothing when you weigh yourself. Record your weight daily. Bring your recorded weights to your clinic visits. Tell your caregiver right away if you have gained 3 lb/1.4 kg or more in 1 day, 5 lb/2.3 kg in a week, or whatever amount you were told to report. SEEK IMMEDIATE MEDICAL CARE IF:   You are unable to keep fluids down.   You start to throw up (vomit) or diarrhea keeps coming back (persistent).   Abdominal pain develops, increases, or can be felt in one place (localizes).   You have an oral temperature above 102 F (38.9 C), not controlled by medicine.   Diarrhea contains blood or mucus.   You develop excessive weakness, dizziness, fainting, or extreme thirst.  MAKE SURE YOU:   Understand these instructions.   Will watch your condition.   Will get help right away if you are not doing well or get worse.  Document Released: 03/28/2003 Document Revised: 12/25/2010 Document Reviewed: 07/19/2008 [facility] Patient Information 2012 [place], [place].

## 2011-03-31 NOTE — ED Provider Notes (Signed)
History     CSN: [id number]  Arrival date & time 03/31/11  1128   First MD Initiated Contact with Patient 03/31/11 1223      Chief Complaint  Patient presents with  . Emesis  . Diarrhea    (Consider location/radiation/quality/duration/timing/severity/associated sxs/prior treatment) Patient is a 26 y.o. male presenting with vomiting and diarrhea. The history is provided by the patient.  Emesis  This is a new problem. The current episode started more than 2 days ago. The problem occurs 5 to 10 times per day. The problem has not changed since onset.There has been no fever. Associated symptoms include diarrhea and myalgias. Pertinent negatives include no chills and no fever. Associated symptoms comments: Nausea, vomiting and diarrhea for the past 4 days without significant abdominal or other pain. No fever. No sick contacts are known. No bloody emesis or bowel movements..  Diarrhea The primary symptoms include vomiting, diarrhea and myalgias. Primary symptoms do not include fever. The illness began 2 days ago. The onset was gradual.  The illness does not include chills.    Past Medical History  Diagnosis Date  . Anxiety   . Asthma     History reviewed. No pertinent past surgical history.  No family history on file.  History  Substance Use Topics  . Smoking status: Former Games developer  . Smokeless tobacco: Not on file  . Alcohol Use: No      Review of Systems  Constitutional: Negative for fever and chills.  HENT: Negative.   Respiratory: Negative.   Cardiovascular: Negative.   Gastrointestinal: Positive for vomiting and diarrhea.  Musculoskeletal: Positive for myalgias.  Skin: Negative.   Neurological: Negative.     Allergies  Review of patient's allergies indicates no known allergies.  Home Medications   Current Outpatient Rx  Name Route Sig Dispense Refill  . ALBUTEROL SULFATE HFA 108 (90 BASE) MCG/ACT IN AERS Inhalation Inhale 2 puffs into the lungs every 6 (six)  hours as needed. 6.7 g 3  . ALPRAZOLAM 2 MG PO TABS Oral Take 2 mg by mouth 2 (two) times daily.    Marland Kitchen OMEPRAZOLE 40 MG PO CPDR  take 1 capsule by mouth once daily 30 capsule 10    BP 119/73  Pulse 92  Temp 98.6 F (37 C)  Resp 20  SpO2 100%  Physical Exam  Constitutional: He appears well-developed and well-nourished.  HENT:  Head: Normocephalic.  Mouth/Throat: Mucous membranes are dry.  Neck: Normal range of motion. Neck supple.  Cardiovascular: Normal rate and regular rhythm.   Pulmonary/Chest: Effort normal and breath sounds normal.  Abdominal: Soft. Bowel sounds are normal. There is no tenderness. There is no rebound and no guarding.  Musculoskeletal: Normal range of motion.  Neurological: He is alert. No cranial nerve deficit.  Skin: Skin is warm and dry. No rash noted.  Psychiatric: He has a normal mood and affect.    ED Course  Procedures (including critical care time) Multiple dosing Zofran with persistent nausea however improved. He is tolerating sips of liquids and is hesitant to push more secondary to persistent nausea. Will switch to Phenergan. 2 liters fluids given, patient nontoxic and no vomiting in ED. Will discharge home.  Labs Reviewed - No data to display No results found.   No diagnosis found.    MDM          Rodena Medin, PA-C 03/31/11 1611

## 2011-03-31 NOTE — ED Notes (Signed)
Tolerated po's-although states he feels very bloated, would feel better if he could vomit

## 2011-04-10 ENCOUNTER — Other Ambulatory Visit: Payer: Self-pay | Admitting: Family Medicine

## 2011-04-13 NOTE — Telephone Encounter (Signed)
Can we refill this? 

## 2011-04-14 ENCOUNTER — Telehealth: Payer: Self-pay | Admitting: Family Medicine

## 2011-04-14 NOTE — Telephone Encounter (Signed)
Refill request for Xyzal 5 mg take 1 po qd.

## 2011-04-15 NOTE — Telephone Encounter (Signed)
He needs an OV first, it has been well over a year since his last OV

## 2011-04-16 NOTE — ED Provider Notes (Signed)
Evaluation and management procedures were performed by the PA/NP under my supervision/collaboration.   [name], MD 04/16/11 1018 

## 2011-04-16 NOTE — Telephone Encounter (Signed)
Left voice message with below information. 

## 2011-05-26 ENCOUNTER — Other Ambulatory Visit: Payer: Self-pay | Admitting: Family Medicine

## 2011-05-26 NOTE — Telephone Encounter (Signed)
Pt has appt sch for 05-29-2011. Pt is requesting albuterol inhaler refill call into [facility] [address] [phone]. Pt was last seen 01-2010

## 2011-05-27 ENCOUNTER — Encounter: Payer: Self-pay | Admitting: Family Medicine

## 2011-05-28 MED ORDER — ALBUTEROL SULFATE HFA 108 (90 BASE) MCG/ACT IN AERS: 2.0000 | INHALATION_SPRAY | Freq: Four times a day (QID) | RESPIRATORY_TRACT | Status: DC | PRN

## 2011-05-28 NOTE — Telephone Encounter (Signed)
Call in one with 11 rf

## 2011-05-28 NOTE — Telephone Encounter (Signed)
I sent script e-scribe and left voice message for pt 

## 2011-05-29 ENCOUNTER — Encounter: Payer: Self-pay | Admitting: Family Medicine

## 2011-05-29 ENCOUNTER — Ambulatory Visit (INDEPENDENT_AMBULATORY_CARE_PROVIDER_SITE_OTHER): Payer: BC Managed Care – PPO | Admitting: Family Medicine

## 2011-05-29 VITALS — BP 126/70 | HR 104 | Temp 98.4°F | Wt 143.0 lb

## 2011-05-29 DIAGNOSIS — Z9109 Other allergy status, other than to drugs and biological substances: Secondary | ICD-10-CM

## 2011-05-29 DIAGNOSIS — J309 Allergic rhinitis, unspecified: Secondary | ICD-10-CM

## 2011-05-29 DIAGNOSIS — K219 Gastro-esophageal reflux disease without esophagitis: Secondary | ICD-10-CM

## 2011-05-29 DIAGNOSIS — M7710 Lateral epicondylitis, unspecified elbow: Secondary | ICD-10-CM

## 2011-05-29 MED ORDER — MOMETASONE FUROATE 50 MCG/ACT NA SUSP: 2.0000 | Freq: Every day | NASAL | Status: DC

## 2011-05-29 MED ORDER — LEVOCETIRIZINE DIHYDROCHLORIDE 5 MG PO TABS: 5.0000 mg | ORAL_TABLET | Freq: Every evening | ORAL | Status: DC

## 2011-05-29 MED ORDER — ETODOLAC 500 MG PO TABS: 500.0000 mg | ORAL_TABLET | Freq: Two times a day (BID) | ORAL | Status: DC

## 2011-05-29 MED ORDER — OMEPRAZOLE 40 MG PO CPDR: 40.0000 mg | DELAYED_RELEASE_CAPSULE | Freq: Every day | ORAL | Status: DC

## 2011-05-29 NOTE — Progress Notes (Signed)
  Subjective:    Patient ID: Travis [name], male    DOB: 04-13-1985, 26 y.o.   MRN: [id number]  HPI Here for med refills and to discuss pain in his arms and hands. 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 This is repetitive, and he gets pain in both elbows which radiates down th arms to the hands. He went to [facility] 6 months ago for this, and he was given a steroid shot in the right elbow. This helped for a time but now the pain is back. His allergies are well controlled. It has now been over a year since he quit smoking cigarettes. He is now using electronic cigarettes.    Review of Systems  Constitutional: Negative.   HENT: Positive for congestion and sneezing.   Respiratory: Negative.   Cardiovascular: Negative.   Musculoskeletal: Positive for arthralgias.       Objective:   Physical Exam  Constitutional: He appears well-developed and well-nourished.  HENT:  Right Ear: External ear normal.  Left Ear: External ear normal.  Nose: Nose normal.  Mouth/Throat: Oropharynx is clear and moist.  Eyes: Conjunctivae are normal.  Pulmonary/Chest: Effort normal and breath sounds normal.  Musculoskeletal:       Very tender over both lateral epicondyles  Lymphadenopathy:    He has no cervical adenopathy.          Assessment & Plan:  Try Etodolac, ice, and support bands for the tennis elbows. Refilled other meds

## 2011-08-25 ENCOUNTER — Telehealth: Payer: Self-pay

## 2011-08-25 ENCOUNTER — Other Ambulatory Visit: Payer: Self-pay | Admitting: Family Medicine

## 2011-08-25 NOTE — Telephone Encounter (Signed)
Request for Ciclopirox 1% shampoo. Last Ov was 05/29/11. Ok to refill?

## 2011-08-26 MED ORDER — CICLOPIROX 1 % EX SHAM: 1.0000 "application " | MEDICATED_SHAMPOO | Freq: Every day | CUTANEOUS | Status: DC

## 2011-08-26 NOTE — Telephone Encounter (Signed)
I sent script e-scribe. 

## 2011-08-26 NOTE — Telephone Encounter (Signed)
May refill for one year

## 2011-09-11 ENCOUNTER — Telehealth: Payer: Self-pay | Admitting: Family Medicine

## 2011-09-11 NOTE — Telephone Encounter (Signed)
Caller: Travis [name]/Patient; Phone: [phone]; Reason for Call: Pt spoke w/office last week re his insurance denying refill on Omeprazole 40mg .  Please call him to advise if you can call in Omeprazole 20mg  instead, this was suggested by pharmacy.

## 2011-09-14 ENCOUNTER — Telehealth: Payer: Self-pay | Admitting: Family Medicine

## 2011-09-14 MED ORDER — OMEPRAZOLE 20 MG PO CPDR: 20.0000 mg | DELAYED_RELEASE_CAPSULE | Freq: Every day | ORAL | Status: DC

## 2011-09-14 NOTE — Telephone Encounter (Signed)
I sent script e-scribe to [facility] and left voice message for pt.

## 2011-09-14 NOTE — Telephone Encounter (Signed)
[facility] denied this patient's PA for Omepreazole 40mg . They cover higher dose therpay for only 90 days. It is covered for MORE than 90 days for several Dx, but Peptic Ulcer Disease is not one of them. The only grounds I have to appeal on is that the pt also has asthma. Please advise on how you'd like me to proceed. Thank you.

## 2011-09-14 NOTE — Telephone Encounter (Signed)
Call in Omeprazole 20 mg a day for one year

## 2011-09-15 NOTE — Telephone Encounter (Signed)
I sent script in to local pharmacy.

## 2011-09-15 NOTE — Telephone Encounter (Signed)
Travis [name], please change rx per Dr Claris Che request below. Thank you.

## 2011-09-15 NOTE — Telephone Encounter (Signed)
I assume they would cover Omeprazole 20 mg a day, so change the rx to that please. Once a day .

## 2011-10-08 ENCOUNTER — Encounter: Payer: Self-pay | Admitting: Internal Medicine

## 2011-11-24 ENCOUNTER — Telehealth: Payer: Self-pay | Admitting: Family Medicine

## 2011-11-24 NOTE — Telephone Encounter (Signed)
Calling regarding dandruff, wants to know if Dr. Clent Ridges will call in a refill for shampoo that was prescribed before. Has tried OTC shampoo and no relief, states the prescription shampoo works well. Per [employer] EMR was called in 08/26/11 and refilled for a year. Declined triage and states he will call [facility].

## 2011-12-04 ENCOUNTER — Ambulatory Visit: Payer: BC Managed Care – PPO | Admitting: Family Medicine

## 2011-12-04 DIAGNOSIS — Z0289 Encounter for other administrative examinations: Secondary | ICD-10-CM

## 2012-02-12 ENCOUNTER — Encounter: Payer: Self-pay | Admitting: Internal Medicine

## 2012-02-25 ENCOUNTER — Encounter: Payer: Self-pay | Admitting: *Deleted

## 2012-03-26 ENCOUNTER — Encounter (HOSPITAL_COMMUNITY): Payer: Self-pay | Admitting: Emergency Medicine

## 2012-03-26 ENCOUNTER — Emergency Department (HOSPITAL_COMMUNITY)
Admission: EM | Admit: 2012-03-26 | Discharge: 2012-03-26 | Disposition: A | Discharge status: Home / Self Care | Payer: BC Managed Care – PPO | Attending: Emergency Medicine | Admitting: Emergency Medicine

## 2012-03-26 ENCOUNTER — Emergency Department (INDEPENDENT_AMBULATORY_CARE_PROVIDER_SITE_OTHER): Payer: BC Managed Care – PPO

## 2012-03-26 ENCOUNTER — Emergency Department (HOSPITAL_COMMUNITY)
Admission: EM | Admit: 2012-03-26 | Discharge: 2012-03-26 | Disposition: A | Discharge status: Home / Self Care | Payer: BC Managed Care – PPO | Source: Home / Self Care

## 2012-03-26 ENCOUNTER — Emergency Department (HOSPITAL_COMMUNITY): Payer: BC Managed Care – PPO

## 2012-03-26 DIAGNOSIS — F329 Major depressive disorder, single episode, unspecified: Secondary | ICD-10-CM | POA: Insufficient documentation

## 2012-03-26 DIAGNOSIS — J9383 Other pneumothorax: Secondary | ICD-10-CM

## 2012-03-26 DIAGNOSIS — J45909 Unspecified asthma, uncomplicated: Secondary | ICD-10-CM | POA: Insufficient documentation

## 2012-03-26 DIAGNOSIS — F411 Generalized anxiety disorder: Secondary | ICD-10-CM | POA: Insufficient documentation

## 2012-03-26 DIAGNOSIS — J939 Pneumothorax, unspecified: Secondary | ICD-10-CM

## 2012-03-26 DIAGNOSIS — Z87891 Personal history of nicotine dependence: Secondary | ICD-10-CM | POA: Insufficient documentation

## 2012-03-26 DIAGNOSIS — R000 Tachycardia, unspecified: Secondary | ICD-10-CM | POA: Insufficient documentation

## 2012-03-26 DIAGNOSIS — F3289 Other specified depressive episodes: Secondary | ICD-10-CM | POA: Insufficient documentation

## 2012-03-26 DIAGNOSIS — Z8719 Personal history of other diseases of the digestive system: Secondary | ICD-10-CM | POA: Insufficient documentation

## 2012-03-26 DIAGNOSIS — Z79899 Other long term (current) drug therapy: Secondary | ICD-10-CM | POA: Insufficient documentation

## 2012-03-26 HISTORY — DX: Pneumothorax, unspecified: J93.9

## 2012-03-26 MED ORDER — SODIUM CHLORIDE 0.9 % IV BOLUS (SEPSIS)
1000.0000 mL | Freq: Once | INTRAVENOUS | Status: AC
  Administered 2012-03-26: 1000 mL via INTRAVENOUS

## 2012-03-26 MED ORDER — ALBUTEROL SULFATE HFA 108 (90 BASE) MCG/ACT IN AERS
2.0000 | INHALATION_SPRAY | Freq: Once | RESPIRATORY_TRACT | Status: AC
  Administered 2012-03-26: 2 via RESPIRATORY_TRACT
  Filled 2012-03-26: qty 6.7

## 2012-03-26 MED ORDER — MORPHINE SULFATE 4 MG/ML IJ SOLN
4.0000 mg | Freq: Once | INTRAMUSCULAR | Status: AC
  Administered 2012-03-26: 4 mg via INTRAVENOUS
  Filled 2012-03-26: qty 1

## 2012-03-26 MED ORDER — DIPHENHYDRAMINE HCL 50 MG/ML IJ SOLN
12.5000 mg | Freq: Once | INTRAMUSCULAR | Status: AC
  Administered 2012-03-26: 12.5 mg via INTRAVENOUS
  Filled 2012-03-26: qty 1

## 2012-03-26 MED ORDER — ONDANSETRON HCL 4 MG/2ML IJ SOLN
4.0000 mg | Freq: Once | INTRAMUSCULAR | Status: AC
  Administered 2012-03-26: 4 mg via INTRAVENOUS
  Filled 2012-03-26: qty 2

## 2012-03-26 MED ORDER — OXYCODONE-ACETAMINOPHEN 5-325 MG PO TABS: 1.0000 | ORAL_TABLET | ORAL | Status: DC | PRN

## 2012-03-26 NOTE — ED Provider Notes (Signed)
History     CSN: [id number]  Arrival date & time 03/26/12  1741   First MD Initiated Contact with Patient 03/26/12 1802      Chief Complaint  Patient presents with  . Chest Pain    (Consider location/radiation/quality/duration/timing/severity/associated sxs/prior treatment) The history is provided by the patient and medical records.    27 year old male with a past medical history of spontaneous pneumothorax presents to emergency department from [facility] in urgent care center with new onset spontaneous pneumothorax that began 3 days ago.  He said chest x-ray shows pneumothorax of approximately 10% in the right upper lobe.  Patient complained of dyspnea and chest pain.  He has some tachycardia.  The patient has had some pain medicine at home that has not relieved his pain.  His pain is worsened when lying flat or in the right lateral decubitus position.  Patient previous cigarette smoker.  He has not had cigarettes for the past 2 years.  He stopped smoking marijuana approximately 30 days ago.  He is currently using electronic cigarettes.  Past Medical History  Diagnosis Date  . Anxiety   . Asthma   . Depression     sees Dr. Dallas Schimke in [place]   . Barrett esophagus   . Pneumothorax     History reviewed. No pertinent past surgical history.  Family History  Problem Relation Age of Onset  . Alcohol abuse      History  Substance Use Topics  . Smoking status: Former Smoker    Types: Cigarettes  . Smokeless tobacco: Never Used     Comment: electronic cigarette  . Alcohol Use: No     Comment: twice a month      Review of Systems Ten systems reviewed and are negative for acute change, except as noted in the HPI.   Allergies  Review of patient's allergies indicates no known allergies.  Home Medications   Current Outpatient Rx  Name  Route  Sig  Dispense  Refill  . albuterol (PROVENTIL HFA;VENTOLIN HFA) 108 (90 BASE) MCG/ACT inhaler   Inhalation   Inhale 2  puffs into the lungs every 6 (six) hours as needed.   1 Inhaler   11   . alprazolam (XANAX) 2 MG tablet   Oral   Take 2 mg by mouth 2 (two) times daily.         . Ciclopirox 1 % shampoo   Topical   Apply 1 application topically daily.   120 mL   11   . etodolac (LODINE) 500 MG tablet   Oral   Take 1 tablet (500 mg total) by mouth 2 (two) times daily.   60 tablet   5   . levocetirizine (XYZAL) 5 MG tablet   Oral   Take 1 tablet (5 mg total) by mouth every evening.   30 tablet   11   . mometasone (NASONEX) 50 MCG/ACT nasal spray   Nasal   Place 2 sprays into the nose daily.   17 g   11   . omeprazole (PRILOSEC) 20 MG capsule   Oral   Take 1 capsule (20 mg total) by mouth daily.   30 capsule   11     BP 147/81  Pulse 102  Temp(Src) 98.2 F (36.8 C) (Oral)  Resp 20  SpO2 98%  Physical Exam  Nursing note and vitals reviewed. Constitutional: He is oriented to person, place, and time. He appears well-developed and well-nourished. No distress.  HENT:  Head: Normocephalic and atraumatic.  Eyes: Conjunctivae are normal. No scleral icterus.  Neck: Normal range of motion. Neck supple. No JVD present. No tracheal deviation present.  Cardiovascular: Regular rhythm and normal heart sounds.   Mild tachycardia  Pulmonary/Chest: Effort normal.  Pleural rub present over the medial and right middle and lower lobes.  Breath sounds are decreased in the apix of the right lung.  Abdominal: Soft. Bowel sounds are normal. There is no tenderness. There is no rebound.  Musculoskeletal: He exhibits no edema.  Neurological: He is alert and oriented to person, place, and time.  Skin: Skin is warm and dry. He is not diaphoretic.  Psychiatric: His behavior is normal.    ED Course  Procedures (including critical care time)  Labs Reviewed - No data to display Dg Chest 2 View  03/26/2012  *RADIOLOGY REPORT*  Clinical Data: Cough and chest pain.  History of pneumothorax.  CHEST - 2  VIEW  Comparison: 12/26/2007  Findings: Midline trachea.  Normal heart size and mediastinal contours.  No pleural fluid.  An approximate 10% right apical pneumothorax identified.  No mediastinal shift.  The lungs are clear.  IMPRESSION: Approximately 10% right apical pneumothorax.  Case was discussed with the clinical service by Dr.McCullough at 5:02 p.m.   Original Report Authenticated By: Jeronimo Greaves, M.D.      No diagnosis found.    MDM  6:38 PM BP 147/81  Pulse 102  Temp(Src) 98.2 F (36.8 C) (Oral)  Resp 20  SpO2 98% Patient with spontaneous pneumothorax.  There is no signs of tension pneumothorax which is tracheal deviation or jugular venous distention.  His oxygen saturation is 100% on room air.  Some mild tachycardia.  Suspect this is due to his pain.  He will be given fluids and oxygen.  Mild pain relief.     7:46 PM BP 137/75  Pulse 90  Temp(Src) 98.2 F (36.8 C) (Oral)  Resp 26  SpO2 98% Patient is resting comfortably. Histachycardia has decreased.  Plan is to repeat his chest xray at 9:00PM.  Patient repeat chest x-ray shows some improvement of the pneumothorax.  Feel that the patient is safe for discharge at this point.  I will discharge him home with pain medication and work release note.  The patient should return for repeat EKG in 48 hours.  At this time there does not appear to be any evidence of an acute emergency medical condition and the patient appears stable for discharge with appropriate outpatient follow up.Diagnosis was discussed with patient who verbalizes understanding and is agreeable to discharge. Pt case discussed with Dr. Fredderick Phenix who agrees with my plan.    Arthor Captain, PA-C 03/30/12 2022

## 2012-03-26 NOTE — ED Notes (Addendum)
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   He is alert w/no signs of acute distress.

## 2012-03-26 NOTE — ED Notes (Signed)
Patient Demographics  Travis [name], is a 27 y.o. male  ZOX:[id number]  [id number]:[id number]  DOB - [birth year]  Chief Complaint  Patient presents with  . Back Pain  . Chest Pain        Subjective:   Travis [name] is here with 3 day history of right-sided chest and upper back pain which started suddenly 3 days ago, patient says this is similar to the feeling he had when he had pneumothorax on the left side, no shortness of breath, no cough, he quit smoking one year ago, denies any history of COPD or emphysema.  Objective:    Filed Vitals:   03/26/12 1653  BP: 120/66  Pulse: 110  Temp: 97.9 F (36.6 C)  TempSrc: Oral  Resp: 18  SpO2: 100%     Exam  Awake Alert, Oriented X 3, No new F.N deficits, Normal affect [place].AT,PERRAL Supple Neck,No JVD, No cervical lymphadenopathy appriciated.  Symmetrical Chest wall movement, Good air movement bilaterally, loss of right apical breath sounds RRR,No Gallops,Rubs or new Murmurs, No Parasternal Heave +ve B.Sounds, Abd Soft, Non tender, No organomegaly appriciated, No rebound - guarding or rigidity. No Cyanosis, Clubbing or edema, No new Rash or bruise       Data Review   CBC No results found for this basename: WBC, HGB, HCT, PLT, MCV, MCH, MCHC, RDW, NEUTRABS, LYMPHSABS, MONOABS, EOSABS, BASOSABS, BANDABS, BANDSABD,  in the last 168 hours  Chemistries   No results found for this basename: NA, K, CL, CO2, GLUCOSE, BUN, CREATININE, GFRCGP, CALCIUM, MG, AST, ALT, ALKPHOS, BILITOT,  in the last 168 hours ------------------------------------------------------------------------------------------------------------------ No results found for this basename: HGBA1C,  in the last 72 hours ------------------------------------------------------------------------------------------------------------------ No results found for this basename: CHOL, HDL, LDLCALC, TRIG, CHOLHDL, LDLDIRECT,  in the last 72  hours ------------------------------------------------------------------------------------------------------------------ No results found for this basename: TSH, T4TOTAL, FREET3, T3FREE, THYROIDAB,  in the last 72 hours ------------------------------------------------------------------------------------------------------------------ No results found for this basename: VITAMINB12, FOLATE, FERRITIN, TIBC, IRON, RETICCTPCT,  in the last 72 hours  Coagulation profile  No results found for this basename: INR, PROTIME,  in the last 168 hours     Prior to Admission medications   Medication Sig Start Date End Date Taking? Authorizing Pavan Bring  alprazolam Prudy Feeler) 2 MG tablet Take 2 mg by mouth 2 (two) times daily.   Yes Historical Ingram Onnen, MD  levocetirizine (XYZAL) 5 MG tablet Take 1 tablet (5 mg total) by mouth every evening. 05/29/11  Yes Nelwyn Salisbury, MD  omeprazole (PRILOSEC) 20 MG capsule Take 1 capsule (20 mg total) by mouth daily. 09/14/11 09/13/12 Yes Nelwyn Salisbury, MD  albuterol (PROVENTIL HFA;VENTOLIN HFA) 108 (90 BASE) MCG/ACT inhaler Inhale 2 puffs into the lungs every 6 (six) hours as needed. 05/28/11   Nelwyn Salisbury, MD  Ciclopirox 1 % shampoo Apply 1 application topically daily. 08/26/11   Nelwyn Salisbury, MD  etodolac (LODINE) 500 MG tablet Take 1 tablet (500 mg total) by mouth 2 (two) times daily. 05/29/11 05/28/12  Nelwyn Salisbury, MD  mometasone (NASONEX) 50 MCG/ACT nasal spray Place 2 sprays into the nose daily. 05/29/11   Nelwyn Salisbury, MD     Assessment & Plan   Right-sided pneumothorax there for the last 3 days. - appears to be between 15-20%. He should has had pneumothorax on the left side in the past, at this point will be transported to [place], will need to be admitted for oxygen, serial x-rays, pulmonary input. I think this is self  contained and may not require chest tube.   Leroy Sea M.D on 03/26/2012 at 5:06 PM   Leroy Sea, MD 03/26/12 [phone]

## 2012-03-26 NOTE — ED Notes (Signed)
Called first nurse at [facility] ED to see if they had a room avail for a pt that was being tx by shuttle Per Melissa, they do not and should be sent by EMS if they need a room  [facility] has been dispatched and given report Gave report to Efraim Kaufmann, Press photographer.

## 2012-03-26 NOTE — ED Notes (Signed)
Pt went to urgent care after having right chest and right abdominal pain. Was sent from urgent care for pneumothorax. Pt has a history of pneumothorax on the left side and had chest tubes.

## 2012-03-28 ENCOUNTER — Telehealth: Payer: Self-pay | Admitting: Family Medicine

## 2012-03-28 MED ORDER — ALBUTEROL SULFATE (2.5 MG/3ML) 0.083% IN NEBU: 2.5000 mg | INHALATION_SOLUTION | RESPIRATORY_TRACT | Status: DC | PRN

## 2012-03-28 NOTE — Telephone Encounter (Signed)
Patient Information:  Caller Name: Harm  Phone: [phone]  Patient: Travis [name], Travis [name]  Gender: Male  DOB: 1985/02/28  Age: 27 Years  PCP: Gershon Crane [facility])  Office Follow Up:  Does the office need to follow up with this patient?: Yes  Instructions For The Office: Patient is requesting refill of his  albuterol for the nebulizer. Drug store is [facility] at [phone]  RN Note:  Patient was seen in ER on 03/26/12 for a small pneumothorax.  It was too small for surgery or chest tubes < 10% so patient was sent home.  This is the second and much milder one that he has had.  Also has history of Asthma.  RN offered to triaged but declined.  Patient in conversation short of breath or demonstrating emergent symptoms.  He is requesting to have his Albuterol solution for nebulizer refilled.  He has his rescue inhaler, but feels like he can get a better breath by doing the albuterol nebulizer 3 times a day.  Drug store of choice is [facility] on [address] at [phone].  Symptoms  Reason For Call & Symptoms: Patient is requesting a refill on Albuterol for nebulizer  Reviewed Health History In EMR: N/A  Reviewed Medications In EMR: N/A  Reviewed Allergies In EMR: N/A  Reviewed Surgeries / Procedures: N/A  Date of Onset of Symptoms: 03/26/2012  Guideline(s) Used:  Ankle and Foot Injury  No Protocol Available - Information Only  No Protocol Available - Sick Adult  Disposition Per Guideline:   Discuss with PCP and Callback by Nurse Today  Reason For Disposition Reached:   Nursing judgment  Advice Given:  Call Back If:  You become worse.

## 2012-03-28 NOTE — Telephone Encounter (Signed)
I sent script e-scribe and spoke with pt. 

## 2012-03-28 NOTE — Telephone Encounter (Signed)
Call in Albuterol 0.083% vials to use per nebulizer q 4 hours for SOB, #50 with 5 rf

## 2012-04-01 NOTE — ED Provider Notes (Signed)
Medical screening examination/treatment/procedure(s) were performed by non-physician practitioner and as supervising physician I was immediately available for consultation/collaboration.   Rolan Bucco, MD 04/01/12 1059

## 2012-04-06 ENCOUNTER — Encounter: Payer: Self-pay | Admitting: Internal Medicine

## 2012-04-06 ENCOUNTER — Ambulatory Visit (INDEPENDENT_AMBULATORY_CARE_PROVIDER_SITE_OTHER): Payer: BC Managed Care – PPO | Admitting: Internal Medicine

## 2012-04-06 VITALS — BP 98/70 | HR 96 | Ht 66.5 in | Wt 148.4 lb

## 2012-04-06 DIAGNOSIS — K2100 Gastro-esophageal reflux disease with esophagitis, without bleeding: Secondary | ICD-10-CM

## 2012-04-06 DIAGNOSIS — K219 Gastro-esophageal reflux disease without esophagitis: Secondary | ICD-10-CM

## 2012-04-06 MED ORDER — OMEPRAZOLE 40 MG PO CPDR: 40.0000 mg | DELAYED_RELEASE_CAPSULE | Freq: Two times a day (BID) | ORAL | Status: DC

## 2012-04-06 NOTE — Patient Instructions (Addendum)
We have sent the following medications to your pharmacy for you to pick up at your convenience: Omeprazole 40 mg twice daily  CC: Dr Gershon Crane

## 2012-04-06 NOTE — Progress Notes (Signed)
ROBERTA KELLY 1985-04-05 MRN [id number]   History of Present Illness:  This is a 27 year old white male with chronic gastroesophageal reflux and erosive esophagitis found on an upper endoscopy in 2009. He did not have Barrett's esophagus. He was hospitalized with nausea and vomiting. Since then, he has stopped drinking alcohol and smoking. His symptoms are perfectly well controlled on Prilosec 40 mg once or twice a day. He currently denies any dysphagia or hematochezia. He has a history of asthmatic bronchitis and spontaneous pneumothorax. He avoids heavy meals and eating late at night. He is following antireflux measures. His weight has remained stable. He works full time and avoids eating fatty food and lifting heavy objects at work which causes reflux.   Past Medical History  Diagnosis Date  . Anxiety   . Asthma   . Depression     sees Dr. Dallas Schimke in [place]   . Barrett esophagus   . Pneumothorax    Past Surgical History  Procedure Laterality Date  . Pleural scarification  2007, 03/26/12    x2    reports that he quit smoking about 22 months ago. His smoking use included Cigarettes. He smoked 0.00 packs per day. He has never used smokeless tobacco. He reports that he does not drink alcohol or use illicit drugs. family history includes Heart attack in his mother. No Known Allergies      Review of Systems: Negative for dysphagia, odynophagia, heartburn  The remainder of the 10 point ROS is negative except as outlined in H&P   Physical Exam: General appearance  Well developed, in no distress. Eyes- non icteric. HEENT nontraumatic, normocephalic. Mouth no lesions, tongue papillated, no cheilosis. Neck supple without adenopathy, thyroid not enlarged, no carotid bruits, no JVD. Lungs Clear to auscultation bilaterally. Cor normal S1, normal S2, regular rhythm, no murmur,  quiet precordium. Abdomen: Muscular. Normoactive bowel sounds. Nontender. No distention. Rectal:  Not done.  Extremities no pedal edema. Skin no lesions. Neurological alert and oriented x 3. Psychological normal mood and affect.  Assessment and Plan:  Problem #72 27 year old white male with chronic gastroesophageal reflux and erosive esophagitis on EGD currently under good control with omeprazole 40 mg once or twice a day . He usually takes one pill in the morning and possibly another one at night. He has modified his lifestyle to follow antireflux measures and avoid smoking and excessive alcohol. We will provide refills for his omeprazole 40 mg twice a day for 90 day for a total of #180 with 3 refills.   04/06/2012 [name]

## 2012-05-02 ENCOUNTER — Telehealth: Payer: Self-pay | Admitting: Internal Medicine

## 2012-05-03 NOTE — Telephone Encounter (Signed)
Case ID: [id number] Member Number: [id number]  Case Type: Initial Review Case Start Date: 05/03/2012  Case Status: This case requires further review by [facility], the plan, or a designee of the plan. You will receive notification of the determination shortly, generally within 1 business day.    Patient  First Name: Travis Patient  Last Name: [name] DOB: Apr 13, 1985  Patient  Street Address: [address]  Patient City: [place] Patient State: [place] Patient Zip: [phone]   Drug Name  & Strength: Omeprazole 40 Mg

## 2012-05-09 ENCOUNTER — Telehealth: Payer: Self-pay | Admitting: Internal Medicine

## 2012-05-09 NOTE — Telephone Encounter (Signed)
Apparently patient's insurance denied omeprazole rx. The request was completed online for authorization and the twice daily dosing questioning was inadvertently answered incorrectly. Patient has history of Barrett's esophagus and has tried once daily dosing of omeprazole in the past. Therefore an expedited appeal has been attempted and faxed to [phone].

## 2012-05-12 ENCOUNTER — Encounter: Payer: Self-pay | Admitting: Internal Medicine

## 2012-05-12 NOTE — Progress Notes (Signed)
Case ID: [id number] Member Number: [id number]  Case Type: Level 1 Appeal Case Start Date: 05/10/2012  Case Status: Coverage has been APPROVED. You will receive a confirmation letter confirming approval of this medication. The patient will also be notified of this approval via an automated outbound phone call or a letter. Please allow approximately 2 hours to update our system with the approval. Once updated, the prescription can be re-submitted.   Coverage  Start Date: 04/25/2012 Coverage  End Date: 05/11/2017   Patient  First Name: Travis Patient  Last Name: [name] DOB: 1985-09-27  Patient  Street Address: [address]  Patient City: [place] Patient State: [place] Patient Zip: [phone]   Drug Name  & Strength: OMEPRAZOLE 40 MG CAPSULE DR

## 2012-06-11 ENCOUNTER — Other Ambulatory Visit: Payer: Self-pay | Admitting: Family Medicine

## 2012-08-25 ENCOUNTER — Ambulatory Visit (INDEPENDENT_AMBULATORY_CARE_PROVIDER_SITE_OTHER): Payer: BC Managed Care – PPO | Admitting: Family Medicine

## 2012-08-25 ENCOUNTER — Encounter: Payer: Self-pay | Admitting: Family Medicine

## 2012-08-25 VITALS — BP 132/74 | HR 95 | Temp 98.6°F | Wt 165.0 lb

## 2012-08-25 DIAGNOSIS — M7712 Lateral epicondylitis, left elbow: Secondary | ICD-10-CM

## 2012-08-25 DIAGNOSIS — J309 Allergic rhinitis, unspecified: Secondary | ICD-10-CM

## 2012-08-25 DIAGNOSIS — J45909 Unspecified asthma, uncomplicated: Secondary | ICD-10-CM

## 2012-08-25 DIAGNOSIS — M7710 Lateral epicondylitis, unspecified elbow: Secondary | ICD-10-CM

## 2012-08-25 MED ORDER — LEVOCETIRIZINE DIHYDROCHLORIDE 5 MG PO TABS: 5.0000 mg | ORAL_TABLET | Freq: Every evening | ORAL | Status: DC

## 2012-08-25 MED ORDER — CICLOPIROX 1 % EX SHAM: 1.0000 "application " | MEDICATED_SHAMPOO | Freq: Every day | CUTANEOUS | Status: DC

## 2012-08-25 MED ORDER — ALBUTEROL SULFATE HFA 108 (90 BASE) MCG/ACT IN AERS: 2.0000 | INHALATION_SPRAY | RESPIRATORY_TRACT | Status: DC | PRN

## 2012-08-25 NOTE — Patient Instructions (Addendum)
Tennis Elbow  Your caregiver has diagnosed you with a condition often referred to as "tennis elbow." This results from small tears or soreness (inflammation) at the start (origin) of the extensor muscles of the forearm. Although the condition is often called tennis or golfer's elbow, it is caused by any repetitive action performed by your elbow.  HOME CARE INSTRUCTIONS   If the condition has been short lived, rest may be the only treatment required. Using your opposite hand or arm to perform the task may help. Even changing your grip may help rest the extremity. These may even prevent the condition from recurring.   Longer standing problems, however, will often be relieved faster by:   Using anti-inflammatory agents.   Applying ice packs for 30 minutes at the end of the working day, at bed time, or when activities are finished.   Your caregiver may also have you wear a splint or sling. This will allow the inflamed tendon to heal.  At times, steroid injections aided with a local anesthetic will be required along with splinting for 1 to 2 weeks. Two to three steroid injections will often solve the problem. In some long standing cases, the inflamed tendon does not respond to conservative (non-surgical) therapy. Then surgery may be required to repair it.  MAKE SURE YOU:    Understand these instructions.   Will watch your condition.   Will get help right away if you are not doing well or get worse.  Document Released: 01/05/2005 Document Revised: 03/30/2011 Document Reviewed: 08/24/2007  [employer] Patient Information 2014 [employer].

## 2012-08-25 NOTE — Progress Notes (Signed)
  Subjective:    Patient ID: Travis [name], male    DOB: 1985-05-15, 27 y.o.   MRN: [id number]  HPI Patient here with recurrent left elbow pain. Right-hand dominant. Works 2 jobs with lots of repetitive use. One job especially involves a lot of repetitive lifting of boxes. Location is left lateral elbow. Deep soreness. Worse with gripping. No weakness. No medial elbow pain. No history of injury. He's tried occasional icing and anti-inflammatory medications without improvement. Had steroid injection right elbow for similar problem over one year ago the orthopedist which helped  Patient requesting refills of his prescription antihistamine. Also uses Proair inhaler as needed and requesting refills.  Past Medical History  Diagnosis Date  . Anxiety   . Asthma   . Depression     sees Dr. Dallas Schimke in [place]   . Barrett esophagus   . Pneumothorax    Past Surgical History  Procedure Laterality Date  . Pleural scarification  2007, 03/26/12    x2    reports that he quit smoking about 2 years ago. His smoking use included Cigarettes. He smoked 0.00 packs per day. He has never used smokeless tobacco. He reports that he does not drink alcohol or use illicit drugs. family history includes Heart attack in his mother. No Known Allergies    Review of Systems  HENT: Negative for congestion and neck pain.   Respiratory: Negative for cough and shortness of breath.   Neurological: Negative for weakness and numbness.       Objective:   Physical Exam  Constitutional: He appears well-developed and well-nourished.  Cardiovascular: Normal rate and regular rhythm.   Pulmonary/Chest: Effort normal and breath sounds normal. No respiratory distress. He has no wheezes. He has no rales.  Musculoskeletal:  Left elbow - full range of motion. No erythema. No warmth. Tenderness over the lateral epicondylar region. Full grip strength. No medial elbow tenderness          Assessment & Plan:   Left lateral epicondylitis. Continue icing. Consider tennis elbow strap. Discussed risk and benefits of corticosteroid injection and patient consented. Prepped left lateral epicondylar region with Betadine. Using 1 inch 25-gauge needle injected 20 mg of Depo-Medrol and 1 mL of plain Xylocaine without difficulty. Patient tolerated well.  Perennial allergies and mild intermittent asthma. Patient requesting refills today in absence of primary. Refilled pro-air  And Xyzal.

## 2012-09-07 ENCOUNTER — Other Ambulatory Visit: Payer: Self-pay

## 2012-11-10 ENCOUNTER — Encounter: Payer: Self-pay | Admitting: Internal Medicine

## 2012-11-10 ENCOUNTER — Ambulatory Visit (INDEPENDENT_AMBULATORY_CARE_PROVIDER_SITE_OTHER): Payer: BC Managed Care – PPO | Admitting: Internal Medicine

## 2012-11-10 VITALS — BP 120/72 | HR 85 | Temp 98.1°F | Wt 174.0 lb

## 2012-11-10 DIAGNOSIS — J069 Acute upper respiratory infection, unspecified: Secondary | ICD-10-CM

## 2012-11-10 DIAGNOSIS — J029 Acute pharyngitis, unspecified: Secondary | ICD-10-CM

## 2012-11-10 NOTE — Patient Instructions (Signed)

## 2012-11-10 NOTE — Progress Notes (Signed)
HPI  Pt presents to the clinic today with c/o cold symptoms x 2 days. He c/o nasal congestion, sore throat, cough with sputum production and fatigue. He does report low grade fevers. 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 He is having trouble sleeping secondary to the pain. He does have sick contacts. He does have an history of asthma but not allergies.  Review of Systems      Past Medical History  Diagnosis Date  . Anxiety   . Asthma   . Depression     sees Dr. Dallas Schimke in [place]   . Barrett esophagus   . Pneumothorax     Family History  Problem Relation Age of Onset  . Heart attack Mother     History   Social History  . Marital Status: Married    Spouse Name: N/A    Number of Children: 2  . Years of Education: N/A   Occupational History  . Package Games developer   Social History Main Topics  . Smoking status: Former Smoker    Types: Cigarettes    Quit date: 05/09/2010  . Smokeless tobacco: Never Used     Comment: electronic cigarette  . Alcohol Use: No     Comment: twice a month  . Drug Use: No     Comment: haven't had any in 45 days 04/06/12  . Sexual Activity: Not on file   Other Topics Concern  . Not on file   Social History Narrative  . No narrative on file    No Known Allergies   Constitutional: Positive headache, fatigue and fever. Denies abrupt weight changes.  HEENT:  Positive sore throat. Denies eye redness, eye pain, pressure behind the eyes, facial pain, nasal congestion, ear pain, ringing in the ears, wax buildup, runny nose or bloody nose. Respiratory: Positive cough. Denies difficulty breathing or shortness of breath.  Cardiovascular: Denies chest pain, chest tightness, palpitations or swelling in the hands or feet.   No other specific complaints in a complete review of systems (except as listed in HPI above).  Objective:   BP 120/72  Pulse 85  Temp(Src) 98.1 F (36.7 C) (Oral)  Wt 174 lb (78.926 kg)  BMI 27.67 kg/m2   SpO2 95% Wt Readings from Last 3 Encounters:  11/10/12 174 lb (78.926 kg)  08/25/12 165 lb (74.844 kg)  04/06/12 148 lb 6 oz (67.302 kg)     General: Appears his stated age, well developed, well nourished in NAD. HEENT: Head: normal shape and size; Eyes: sclera white, no icterus, conjunctiva pink, PERRLA and EOMs intact; Ears: Tm's gray and intact, normal light reflex; Nose: mucosa pink and moist, septum midline; Throat/Mouth: + PND. Teeth present, mucosa erythematous and moist, no exudate noted, no lesions or ulcerations noted.  Neck: Right tonsillar lymphadenopathy. Neck supple, trachea midline. No massses, lumps or thyromegaly present.  Cardiovascular: Normal rate and rhythm. S1,S2 noted.  No murmur, rubs or gallops noted. No JVD or BLE edema. No carotid bruits noted. Pulmonary/Chest: Normal effort and positive vesicular breath sounds. No respiratory distress. No wheezes, rales or ronchi noted.      Assessment & Plan:   Upper Respiratory Infection, with pharyngitis, likely viral:  Get some rest and drink plenty of water Do salt water gargles for the sore throat RST-negative Supportive care at home- rest, tylenol  RTC as needed or if symptoms persist.

## 2012-11-14 ENCOUNTER — Emergency Department (HOSPITAL_COMMUNITY): Payer: BC Managed Care – PPO

## 2012-11-14 ENCOUNTER — Encounter (HOSPITAL_COMMUNITY): Payer: Self-pay | Admitting: Emergency Medicine

## 2012-11-14 ENCOUNTER — Emergency Department (HOSPITAL_COMMUNITY)
Admission: EM | Admit: 2012-11-14 | Discharge: 2012-11-14 | Disposition: A | Discharge status: Home / Self Care | Payer: BC Managed Care – PPO | Attending: Emergency Medicine | Admitting: Emergency Medicine

## 2012-11-14 DIAGNOSIS — R071 Chest pain on breathing: Secondary | ICD-10-CM | POA: Insufficient documentation

## 2012-11-14 DIAGNOSIS — H9209 Otalgia, unspecified ear: Secondary | ICD-10-CM | POA: Insufficient documentation

## 2012-11-14 DIAGNOSIS — M545 Low back pain, unspecified: Secondary | ICD-10-CM | POA: Insufficient documentation

## 2012-11-14 DIAGNOSIS — J45909 Unspecified asthma, uncomplicated: Secondary | ICD-10-CM | POA: Insufficient documentation

## 2012-11-14 DIAGNOSIS — F329 Major depressive disorder, single episode, unspecified: Secondary | ICD-10-CM | POA: Insufficient documentation

## 2012-11-14 DIAGNOSIS — R599 Enlarged lymph nodes, unspecified: Secondary | ICD-10-CM | POA: Insufficient documentation

## 2012-11-14 DIAGNOSIS — F3289 Other specified depressive episodes: Secondary | ICD-10-CM | POA: Insufficient documentation

## 2012-11-14 DIAGNOSIS — Z79899 Other long term (current) drug therapy: Secondary | ICD-10-CM | POA: Insufficient documentation

## 2012-11-14 DIAGNOSIS — J069 Acute upper respiratory infection, unspecified: Secondary | ICD-10-CM | POA: Insufficient documentation

## 2012-11-14 DIAGNOSIS — K2270 Barrett's esophagus without dysplasia: Secondary | ICD-10-CM | POA: Insufficient documentation

## 2012-11-14 DIAGNOSIS — F411 Generalized anxiety disorder: Secondary | ICD-10-CM | POA: Insufficient documentation

## 2012-11-14 DIAGNOSIS — IMO0002 Reserved for concepts with insufficient information to code with codable children: Secondary | ICD-10-CM | POA: Insufficient documentation

## 2012-11-14 DIAGNOSIS — Z87891 Personal history of nicotine dependence: Secondary | ICD-10-CM | POA: Insufficient documentation

## 2012-11-14 DIAGNOSIS — R42 Dizziness and giddiness: Secondary | ICD-10-CM | POA: Insufficient documentation

## 2012-11-14 MED ORDER — HYDROCOD POLST-CHLORPHEN POLST 10-8 MG/5ML PO LQCR: 5.0000 mL | Freq: Two times a day (BID) | ORAL | Status: DC | PRN

## 2012-11-14 NOTE — ED Notes (Signed)
Pt presents with c/o sore throat and cough that started last Sunday.

## 2012-11-14 NOTE — ED Provider Notes (Signed)
CSN: [id number]     Arrival date & time 11/14/12  1525 History  This chart was scribed for non-physician practitioner Santiago Glad, PA-C working with Suzi Roots, MD by Valera Castle, ED scribe. This patient was seen in room WTR6/WTR6 and the patient's care was started at 4:02 PM.      Chief Complaint  Patient presents with  . Sore Throat  . Cough   The history is provided by the patient. No language interpreter was used.   HPI Comments: Travis [name] is a 27 y.o. male with h/o asthma who presents to the Emergency Department complaining of sudden sore throat and cough, productive of dark green sputum, onset 8 days ago with associated rhinorrhea, congestion, ear pain, chest wall pain, and lower back pain. He states the chest and back pain are from coughing so much. He denies having a fever, but reports hot and cold flashes. He reports taking his son to the doctor for an illness, and his son was diagnosed with Croup. He reports soon after his symptoms started. He reports being seen himself five days ago, and was diagnosed with a URI. He states they did a strep test, which came back negative. He was told to f/u if the symptoms worsened. He states that his daughter then became ill, and was seen, being diagnosed with strep, so he thinks he may have strep. He reports he has been unable to sleep, due to the symptoms. He reports taking Alka-seltzer cold and cough, with little relief. He denies SOB, since he has been using his inhaler regularly, and denies any other associated symptoms. He reports EtOH, but denies smoking, quit date 05/09/2010.    Past Medical History  Diagnosis Date  . Anxiety   . Asthma   . Depression     se[place] Dr. Dallas Schimke in [place]   . Barrett esophagus   . Pneumothorax    Past Surgical History  Procedure Laterality Date  . Pleural scarification  2007, 03/26/12    x2   Family History  Problem Relation Age of Onset  . Heart attack Mother    History  Substance  Use Topics  . Smoking status: Former Smoker    Types: Cigarettes    Quit date: 05/09/2010  . Smokeless tobacco: Never Used     Comment: electronic cigarette  . Alcohol Use: No     Comment: twice a month    Review of Systems  Constitutional: Negative for fever.  HENT: Positive for congestion, ear pain, rhinorrhea and sore throat.   Respiratory: Positive for cough. Negative for shortness of breath.   Cardiovascular: Positive for chest pain.  Musculoskeletal: Positive for back pain.  Neurological: Positive for light-headedness.  All other systems reviewed and are negative.    Allergies  Review of patient's allergies indicates no known allergies.  Home Medications   Current Outpatient Rx  Name  Route  Sig  Dispense  Refill  . albuterol (PROAIR HFA) 108 (90 BASE) MCG/ACT inhaler   Inhalation   Inhale 2 puffs into the lungs every 4 (four) hours as needed for wheezing.   3 Inhaler   0   . albuterol (PROVENTIL) (2.5 MG/3ML) 0.083% nebulizer solution   Nebulization   Take 3 mLs (2.5 mg total) by nebulization every 4 (four) hours as needed for wheezing.   50 mL   5   . alprazolam (XANAX) 2 MG tablet   Oral   Take 2 mg by mouth 2 (two) times daily.         Marland Kitchen  Ciclopirox 1 % shampoo   Topical   Apply 1 application topically daily.   120 mL   11   . Ciclopirox 1 % shampoo   Topical   Apply topically daily.         Marland Kitchen docusate sodium (COLACE) 100 MG capsule   Oral   Take 100 mg by mouth 2 (two) times daily as needed for constipation.         Marland Kitchen levocetirizine (XYZAL) 5 MG tablet   Oral   Take 1 tablet (5 mg total) by mouth every evening.   90 tablet   3   . mometasone (NASONEX) 50 MCG/ACT nasal spray   Nasal   Place 2 sprays into the nose daily.   17 g   11   . omeprazole (PRILOSEC) 40 MG capsule   Oral   Take 1 capsule (40 mg total) by mouth 2 (two) times daily.   180 capsule   1   . OVER THE COUNTER MEDICATION   Oral   Take 1 packet by mouth daily.  Vitamin pack          Triage Vitals: BP 139/69  Pulse 107  Temp(Src) 98.7 F (37.1 C) (Oral)  Resp 20  Ht 5\' 7"  (1.702 m)  Wt 168 lb (76.204 kg)  BMI 26.31 kg/m2  SpO2 98%  Physical Exam  Nursing note and vitals reviewed. Constitutional: He is oriented to person, place, and time. He appears well-developed and well-nourished. No distress.  HENT:  Head: Normocephalic and atraumatic.  Right Ear: Tympanic membrane, external ear and ear canal normal.  Left Ear: Tympanic membrane, external ear and ear canal normal.  Mouth/Throat: Uvula is midline. No trismus in the jaw. No posterior oropharyngeal edema.  No exudate. No tonsillar enlargement. Mild erythema of oropharynx. Handling secretions well. Anterior cervical lymphadenopathy.   Eyes: EOM are normal.  Neck: Neck supple. No tracheal deviation present.  Cardiovascular: Normal rate, regular rhythm and normal heart sounds.   Pulmonary/Chest: Effort normal and breath sounds normal. No respiratory distress.  Musculoskeletal: Normal range of motion.  Neurological: He is alert and oriented to person, place, and time.  Skin: Skin is warm and dry.  Psychiatric: He has a normal mood and affect. His behavior is normal.    ED Course  Procedures (including critical care time)  DIAGNOSTIC STUDIES: Oxygen Saturation is 98% on room air, normal by my interpretation.    COORDINATION OF CARE: 4:06 PM.-Discussed treatment plan which includes CXR and strep test with pt at bedside and pt agreed to plan.   Labs Review Labs Reviewed  RAPID STREP SCREEN  CULTURE, GROUP A STREP   Imaging Review Dg Chest 2 View  11/14/2012   CLINICAL DATA:  Testing data shortness of breath, cough.  EXAM: CHEST  2 VIEW  COMPARISON:  03/26/2012  FINDINGS: The heart size and mediastinal contours are within normal limits. Both lungs are clear. The visualized skeletal structures are unremarkable. No recurrent pneumothorax.  IMPRESSION: No active cardiopulmonary  disease.   Electronically Signed   By: Charlett Nose M.D.   On: 11/14/2012 16:52    EKG Interpretation   None      No orders of the defined types were placed in this encounter.     MDM  No diagnosis found. Pt CXR negative for acute infiltrate. Rapid strep negative.  Patients symptoms are consistent with URI, likely viral etiology. Discussed that antibiotics are not indicated for viral infections. Pt will be discharged with symptomatic  treatment.  Verbalizes understanding and is agreeable with plan. Pt is hemodynamically stable & in NAD prior to dc.  I personally performed the services described in this documentation, which was scribed in my presence. The recorded information has been reviewed and is accurate.    Santiago Glad, PA-C 11/14/12 1714

## 2012-11-15 NOTE — ED Provider Notes (Signed)
Medical screening examination/treatment/procedure(s) were performed by non-physician practitioner and as supervising physician I was immediately available for consultation/collaboration.  EKG Interpretation   None         Suzi Roots, MD 11/15/12 1250

## 2012-11-16 LAB — CULTURE, GROUP A STREP

## 2012-11-17 ENCOUNTER — Encounter: Payer: Self-pay | Admitting: Internal Medicine

## 2012-11-17 ENCOUNTER — Ambulatory Visit (INDEPENDENT_AMBULATORY_CARE_PROVIDER_SITE_OTHER): Payer: BC Managed Care – PPO | Admitting: Internal Medicine

## 2012-11-17 VITALS — BP 130/84 | HR 103 | Temp 97.4°F | Wt 169.0 lb

## 2012-11-17 DIAGNOSIS — R300 Dysuria: Secondary | ICD-10-CM

## 2012-11-17 DIAGNOSIS — J019 Acute sinusitis, unspecified: Secondary | ICD-10-CM

## 2012-11-17 LAB — POCT URINALYSIS DIPSTICK
Bilirubin, UA: NEGATIVE
Blood, UA: NEGATIVE
Glucose, UA: NEGATIVE
Leukocytes, UA: NEGATIVE
Nitrite, UA: NEGATIVE
Protein, UA: NEGATIVE
Urobilinogen, UA: NEGATIVE
pH, UA: 5

## 2012-11-17 MED ORDER — AMOXICILLIN-POT CLAVULANATE 875-125 MG PO TABS: 1.0000 | ORAL_TABLET | Freq: Two times a day (BID) | ORAL | Status: DC

## 2012-11-17 NOTE — Progress Notes (Signed)
Subjective:    Patient ID: Travis [name], male    DOB: 1985/11/28, 27 y.o.   MRN: [id number]  HPI  Pt presents to the clinic today with c/o fever, headache and left eye redness, irritation and pain. He also continues to have cough with productive brown sputum, nasal congestion, fatigue and sore throat. He has been seen at this office and the ED in the last week. RST was negative at both places. He was diagnosed with acute URI. He was not getting any abx. He is here today because he symptoms are continuing to get worse. He has continue to run low grade fever. His sputum is now bloody. He does report his son has had strep and pink eye within the last 2 weeks. Additionally, he does c/o some burning when he pees. He denies urgency, frequency or hesitancy. He does report having chlamydia in the past but reports this is different He has not had any drainage from the penis.  Review of Systems      Past Medical History  Diagnosis Date  . Anxiety   . Asthma   . Depression     sees Dr. Dallas Schimke in [place]   . Barrett esophagus   . Pneumothorax     Current Outpatient Prescriptions  Medication Sig Dispense Refill  . albuterol (PROAIR HFA) 108 (90 BASE) MCG/ACT inhaler Inhale 2 puffs into the lungs every 4 (four) hours as needed for wheezing.  3 Inhaler  0  . albuterol (PROVENTIL) (2.5 MG/3ML) 0.083% nebulizer solution Take 3 mLs (2.5 mg total) by nebulization every 4 (four) hours as needed for wheezing.  50 mL  5  . alprazolam (XANAX) 2 MG tablet Take 2 mg by mouth 2 (two) times daily.      Marland Kitchen aspirin-sod bicarb-citric acid (ALKA-SELTZER) 325 MG TBEF tablet Take 325 mg by mouth every 6 (six) hours as needed (congestion).      . chlorpheniramine-HYDROcodone (TUSSIONEX PENNKINETIC ER) 10-8 MG/5ML LQCR Take 5 mLs by mouth every 12 (twelve) hours as needed.  115 mL  0  . docusate sodium (COLACE) 100 MG capsule Take 100 mg by mouth 2 (two) times daily as needed for constipation.      Marland Kitchen  ibuprofen (ADVIL,MOTRIN) 200 MG tablet Take 600 mg by mouth every 6 (six) hours as needed for pain.      Marland Kitchen levocetirizine (XYZAL) 5 MG tablet Take 1 tablet (5 mg total) by mouth every evening.  90 tablet  3  . menthol-cetylpyridinium (CEPACOL) 3 MG lozenge Take 1 lozenge by mouth daily as needed for pain (sore throat).      Marland Kitchen omeprazole (PRILOSEC) 40 MG capsule Take 1 capsule (40 mg total) by mouth 2 (two) times daily.  180 capsule  1   No current facility-administered medications for this visit.    No Known Allergies  Family History  Problem Relation Age of Onset  . Heart attack Mother     History   Social History  . Marital Status: Married    Spouse Name: N/A    Number of Children: 2  . Years of Education: N/A   Occupational History  . Package Games developer   Social History Main Topics  . Smoking status: Former Smoker    Types: Cigarettes    Quit date: 05/09/2010  . Smokeless tobacco: Never Used     Comment: electronic cigarette  . Alcohol Use: Yes     Comment: twice a month  . Drug Use: No  .  Sexual Activity: Not on file   Other Topics Concern  . Not on file   Social History Narrative  . No narrative on file     Constitutional: Pt reports fever, headache. Denies malaise, fatigue, or abrupt weight changes.  HEENT: Pt reports left eye redness/pain. Denies ear pain, ringing in the ears, wax buildup, runny nose, nasal congestion, bloody nose, or sore throat. Respiratory: Pt reports cough with sputum production.Denies difficulty breathing, shortness of breath.   Cardiovascular: Denies chest pain, chest tightness, palpitations or swelling in the hands or feet.   GU: Pt reports dysuria. Denies hesitancy, urgency, blood in his urine.  No other specific complaints in a complete review of systems (except as listed in HPI above).  Objective:   Physical Exam  BP 130/84  Pulse 103  Temp(Src) 97.4 F (36.3 C) (Oral)  Wt 169 lb (76.658 kg)  BMI 26.46 kg/m2  SpO2  98% Wt Readings from Last 3 Encounters:  11/17/12 169 lb (76.658 kg)  11/14/12 168 lb (76.204 kg)  11/10/12 174 lb (78.926 kg)    General: Appears his stated age, well developed, well nourished in NAD.  HEENT: Head: normal shape and size; Eyes: sclera pink, no icterus, conjunctiva erythematous, PERRLA and EOMs intact; Ears: Tm's gray and intact, normal light reflex; Nose: mucosa pink and moist, septum midline; Throat/Mouth: Teeth present, mucosa pink and moist, no exudate, lesions or ulcerations noted.   Cardiovascular: Normal rate and rhythm. S1,S2 noted.  No murmur, rubs or gallops noted. No JVD or BLE edema. No carotid bruits noted. Pulmonary/Chest: Normal effort and positive vesicular breath sounds. No respiratory distress. No wheezes, rales or ronchi noted.  Abdomen: Soft and nontender. Normal bowel sounds, no bruits noted. No distention or masses noted. Liver, spleen and kidneys non palpable.  BMET    Component Value Date/Time   NA 142 12/28/2007 0500   K 3.8 12/28/2007 0500   CL 110 12/28/2007 0500   CO2 25 12/28/2007 0500   GLUCOSE 93 12/28/2007 0500   BUN 4* 12/28/2007 0500   CREATININE 0.77 12/28/2007 0500   CALCIUM 8.7 12/28/2007 0500   GFRNONAA >60 12/28/2007 0500   GFRAA  Value: >60        The eGFR has been calculated using the MDRD equation. This calculation has not been validated in all clinical 12/28/2007 0500    Lipid Panel  No results found for this basename: chol, trig, hdl, cholhdl, vldl, ldlcalc    CBC    Component Value Date/Time   WBC 6.1 12/28/2007 0500   RBC 4.17* 12/28/2007 0500   HGB 13.4 DELTA CHECK NOTED RESULT REPEATED AND VERIFIED 12/28/2007 0500   HCT 38.8* 12/28/2007 0500   PLT 156 12/28/2007 0500   MCV 92.9 12/28/2007 0500   MCHC 34.5 12/28/2007 0500   RDW 12.1 12/28/2007 0500   LYMPHSABS 0.9 12/26/2007 1556   MONOABS 0.5 12/26/2007 1556   EOSABS 0.1 12/26/2007 1556   BASOSABS 0.0 12/26/2007 1556    Hgb A1C No results found for this basename: HGBA1C          Assessment & Plan:   Acute Sinusitis, started viral now likely turned into bacterial given duration of symptoms:  eRx for Augmentin x 10 days Tylenol for pain/fever  Conjunctivitis, left eye:  Warm compresses to left eye TID Tylenol for pain fever  Dysuria:  Will check urinalysis Increase your fluid intake Augmentin will treat nitrietes  RTC as needed or if symptoms persist or worsen

## 2012-11-17 NOTE — Patient Instructions (Signed)

## 2012-11-17 NOTE — Addendum Note (Signed)
Addended by: Darnell Level on: 11/17/2012 03:53 PM   Modules accepted: Orders

## 2012-12-08 ENCOUNTER — Other Ambulatory Visit: Payer: Self-pay | Admitting: Family Medicine

## 2013-01-03 ENCOUNTER — Telehealth: Payer: Self-pay | Admitting: Internal Medicine

## 2013-01-03 MED ORDER — OMEPRAZOLE 40 MG PO CPDR: 40.0000 mg | DELAYED_RELEASE_CAPSULE | Freq: Two times a day (BID) | ORAL | Status: DC

## 2013-01-03 NOTE — Telephone Encounter (Signed)
rx sent

## 2013-04-04 ENCOUNTER — Encounter: Payer: Self-pay | Admitting: Podiatry

## 2013-04-04 ENCOUNTER — Ambulatory Visit (INDEPENDENT_AMBULATORY_CARE_PROVIDER_SITE_OTHER): Payer: BC Managed Care – PPO | Admitting: Podiatry

## 2013-04-04 VITALS — BP 134/71 | HR 86 | Ht 67.0 in | Wt 175.0 lb

## 2013-04-04 DIAGNOSIS — M79675 Pain in left toe(s): Secondary | ICD-10-CM

## 2013-04-04 DIAGNOSIS — M79609 Pain in unspecified limb: Secondary | ICD-10-CM

## 2013-04-04 DIAGNOSIS — L600 Ingrowing nail: Secondary | ICD-10-CM | POA: Insufficient documentation

## 2013-04-04 DIAGNOSIS — M79605 Pain in left leg: Secondary | ICD-10-CM

## 2013-04-04 NOTE — Patient Instructions (Signed)
Ingrown nail surgery done on left great toe lateral border.  Follow instruction and return in one week.

## 2013-04-04 NOTE — Progress Notes (Signed)
Subjective:  28 year old male presents with painful left big toe just to walk or stand x 1 week.  Patient thinks he has ingrown nail.  He is grown a new nail plate left great toe after having an incident a few month back. He hit his toe and nail came off.   Objective: Inflamed nail border with incurvated nail left hallux lateral border. No active drainage. No associated cellulitis noted. Neurovascular status are within normal. Rectus foot without gross deformities.  Assessment: Incurvated new nail plate left hallux. Inflamed ingrown nail left hallux lateral border.  Plan: Affected toe was anesthetized with total 5ml mixture of 50/50 0.5% Marcaine plain and 1% Xylocaine plain. Affected left lateral nail border was reflected with a nail elevator and excised with nail nipper. Proximal nail matrix tissue was cauterized with Phenol soaked cotton applicator x 4 and neutralized with Alcohol soaked cotton applicator. The wound was dressed with Amerigel ointment dressing. Home care instructions and supply dispensed.  Return in 1 week for follow up.

## 2013-04-10 ENCOUNTER — Encounter: Payer: BC Managed Care – PPO | Admitting: Podiatry

## 2013-05-31 ENCOUNTER — Other Ambulatory Visit: Payer: Self-pay | Admitting: Internal Medicine

## 2013-08-16 ENCOUNTER — Ambulatory Visit (INDEPENDENT_AMBULATORY_CARE_PROVIDER_SITE_OTHER): Payer: BC Managed Care – PPO | Admitting: Family Medicine

## 2013-08-16 ENCOUNTER — Encounter: Payer: Self-pay | Admitting: Family Medicine

## 2013-08-16 VITALS — BP 115/76 | HR 89 | Temp 98.9°F | Ht 67.0 in | Wt 178.0 lb

## 2013-08-16 DIAGNOSIS — L989 Disorder of the skin and subcutaneous tissue, unspecified: Secondary | ICD-10-CM

## 2013-08-16 NOTE — Progress Notes (Signed)
   Subjective:    Patient ID: Travis [name], male    DOB: Sep 06, 1985, 28 y.o.   MRN: [id number]  HPI Here to look at a lesion on the right cheek that appeared about one month ago. It has no symptoms. It has not changed much in appearance.    Review of Systems  Constitutional: Negative.        Objective:   Physical Exam  Constitutional: He appears well-developed and well-nourished.  Skin:  The right cheek has a 3 mm papular lesion with well defined borders, pinkish in color           Assessment & Plan:  This is concerning for a possible basal cell carcinoma. We will refer to [facility] to evaluate

## 2013-08-16 NOTE — Progress Notes (Signed)
Pre visit review using our clinic review tool, if applicable. No additional management support is needed unless otherwise documented below in the visit note. 

## 2013-10-20 ENCOUNTER — Other Ambulatory Visit: Payer: Self-pay | Admitting: Family Medicine

## 2013-10-25 ENCOUNTER — Other Ambulatory Visit: Payer: Self-pay | Admitting: Internal Medicine

## 2013-10-25 ENCOUNTER — Other Ambulatory Visit: Payer: Self-pay | Admitting: Family Medicine

## 2013-11-25 ENCOUNTER — Encounter (HOSPITAL_COMMUNITY): Payer: Self-pay | Admitting: Emergency Medicine

## 2013-11-25 ENCOUNTER — Emergency Department (HOSPITAL_COMMUNITY)
Admission: EM | Admit: 2013-11-25 | Discharge: 2013-11-25 | Disposition: A | Discharge status: Home / Self Care | Payer: BC Managed Care – PPO | Attending: Emergency Medicine | Admitting: Emergency Medicine

## 2013-11-25 DIAGNOSIS — F329 Major depressive disorder, single episode, unspecified: Secondary | ICD-10-CM | POA: Diagnosis not present

## 2013-11-25 DIAGNOSIS — R509 Fever, unspecified: Secondary | ICD-10-CM | POA: Diagnosis not present

## 2013-11-25 DIAGNOSIS — J45909 Unspecified asthma, uncomplicated: Secondary | ICD-10-CM | POA: Insufficient documentation

## 2013-11-25 DIAGNOSIS — Z7982 Long term (current) use of aspirin: Secondary | ICD-10-CM | POA: Insufficient documentation

## 2013-11-25 DIAGNOSIS — R531 Weakness: Secondary | ICD-10-CM | POA: Insufficient documentation

## 2013-11-25 DIAGNOSIS — R197 Diarrhea, unspecified: Secondary | ICD-10-CM | POA: Diagnosis not present

## 2013-11-25 DIAGNOSIS — R112 Nausea with vomiting, unspecified: Secondary | ICD-10-CM

## 2013-11-25 DIAGNOSIS — F419 Anxiety disorder, unspecified: Secondary | ICD-10-CM | POA: Insufficient documentation

## 2013-11-25 DIAGNOSIS — Z79899 Other long term (current) drug therapy: Secondary | ICD-10-CM | POA: Diagnosis not present

## 2013-11-25 DIAGNOSIS — R51 Headache: Secondary | ICD-10-CM | POA: Diagnosis not present

## 2013-11-25 DIAGNOSIS — M545 Low back pain: Secondary | ICD-10-CM | POA: Diagnosis not present

## 2013-11-25 DIAGNOSIS — Z87891 Personal history of nicotine dependence: Secondary | ICD-10-CM | POA: Diagnosis not present

## 2013-11-25 DIAGNOSIS — R519 Headache, unspecified: Secondary | ICD-10-CM

## 2013-11-25 DIAGNOSIS — Z8709 Personal history of other diseases of the respiratory system: Secondary | ICD-10-CM | POA: Diagnosis not present

## 2013-11-25 LAB — URINALYSIS, ROUTINE W REFLEX MICROSCOPIC
GLUCOSE, UA: NEGATIVE mg/dL
Leukocytes, UA: NEGATIVE
Nitrite: NEGATIVE
PROTEIN: 30 mg/dL — AB
Specific Gravity, Urine: 1.037 — ABNORMAL HIGH (ref 1.005–1.030)
Urobilinogen, UA: 0.2 mg/dL (ref 0.0–1.0)
pH: 6 (ref 5.0–8.0)

## 2013-11-25 LAB — CBC WITH DIFFERENTIAL/PLATELET
Basophils Absolute: 0 10*3/uL (ref 0.0–0.1)
Basophils Relative: 0 % (ref 0–1)
EOS ABS: 0 10*3/uL (ref 0.0–0.7)
EOS PCT: 0 % (ref 0–5)
HEMATOCRIT: 43.1 % (ref 39.0–52.0)
Hemoglobin: 15.7 g/dL (ref 13.0–17.0)
LYMPHS ABS: 0.5 10*3/uL — AB (ref 0.7–4.0)
Lymphocytes Relative: 5 % — ABNORMAL LOW (ref 12–46)
MCH: 31.1 pg (ref 26.0–34.0)
MCHC: 36.4 g/dL — ABNORMAL HIGH (ref 30.0–36.0)
MCV: 85.3 fL (ref 78.0–100.0)
MONO ABS: 0.9 10*3/uL (ref 0.1–1.0)
MONOS PCT: 9 % (ref 3–12)
Neutro Abs: 8.8 10*3/uL — ABNORMAL HIGH (ref 1.7–7.7)
Neutrophils Relative %: 86 % — ABNORMAL HIGH (ref 43–77)
Platelets: 125 10*3/uL — ABNORMAL LOW (ref 150–400)
RBC: 5.05 MIL/uL (ref 4.22–5.81)
RDW: 11.8 % (ref 11.5–15.5)
WBC: 10.3 10*3/uL (ref 4.0–10.5)

## 2013-11-25 LAB — COMPREHENSIVE METABOLIC PANEL
ALT: 25 U/L (ref 0–53)
AST: 30 U/L (ref 0–37)
Albumin: 4.4 g/dL (ref 3.5–5.2)
Alkaline Phosphatase: 62 U/L (ref 39–117)
Anion gap: 15 (ref 5–15)
BUN: 15 mg/dL (ref 6–23)
CO2: 26 meq/L (ref 19–32)
Calcium: 9.4 mg/dL (ref 8.4–10.5)
Chloride: 101 mEq/L (ref 96–112)
Creatinine, Ser: 0.89 mg/dL (ref 0.50–1.35)
GFR calc Af Amer: >90 mL/min (ref >90)
Glucose, Bld: 101 mg/dL — ABNORMAL HIGH (ref 70–99)
Potassium: 4 mEq/L (ref 3.7–5.3)
SODIUM: 142 meq/L (ref 137–147)
Total Bilirubin: 1 mg/dL (ref 0.3–1.2)
Total Protein: 7.5 g/dL (ref 6.0–8.3)

## 2013-11-25 LAB — URINE MICROSCOPIC-ADD ON

## 2013-11-25 LAB — LIPASE, BLOOD: Lipase: 16 U/L (ref 11–59)

## 2013-11-25 MED ORDER — SODIUM CHLORIDE 0.9 % IV BOLUS (SEPSIS)
1000.0000 mL | Freq: Once | INTRAVENOUS | Status: AC
  Administered 2013-11-25: 1000 mL via INTRAVENOUS

## 2013-11-25 MED ORDER — DIPHENHYDRAMINE HCL 50 MG/ML IJ SOLN
25.0000 mg | Freq: Once | INTRAMUSCULAR | Status: AC
  Administered 2013-11-25: 25 mg via INTRAVENOUS
  Filled 2013-11-25: qty 1

## 2013-11-25 MED ORDER — ASPIRIN-ACETAMINOPHEN-CAFFEINE 250-250-65 MG PO TABS
2.0000 | ORAL_TABLET | Freq: Once | ORAL | Status: AC
  Administered 2013-11-25: 2 via ORAL
  Filled 2013-11-25: qty 2

## 2013-11-25 MED ORDER — PROCHLORPERAZINE EDISYLATE 5 MG/ML IJ SOLN: 10.0000 mg | Freq: Once | INTRAMUSCULAR | Status: DC

## 2013-11-25 MED ORDER — PROMETHAZINE HCL 25 MG PO TABS: 25.0000 mg | ORAL_TABLET | Freq: Four times a day (QID) | ORAL | Status: DC | PRN

## 2013-11-25 MED ORDER — METOCLOPRAMIDE HCL 5 MG/ML IJ SOLN
10.0000 mg | Freq: Once | INTRAMUSCULAR | Status: AC
  Administered 2013-11-25: 10 mg via INTRAVENOUS
  Filled 2013-11-25: qty 2

## 2013-11-25 MED ORDER — MORPHINE SULFATE 2 MG/ML IJ SOLN: 2.0000 mg | Freq: Once | INTRAMUSCULAR | Status: DC

## 2013-11-25 MED ORDER — KETOROLAC TROMETHAMINE 30 MG/ML IJ SOLN
30.0000 mg | Freq: Once | INTRAMUSCULAR | Status: AC
  Administered 2013-11-25: 30 mg via INTRAVENOUS
  Filled 2013-11-25: qty 1

## 2013-11-25 MED ORDER — ONDANSETRON 4 MG PO TBDP
4.0000 mg | ORAL_TABLET | Freq: Once | ORAL | Status: AC
  Administered 2013-11-25: 4 mg via ORAL
  Filled 2013-11-25: qty 1

## 2013-11-25 NOTE — ED Provider Notes (Signed)
CSN: [id number]     Arrival date & time 11/25/13  1357 History   First MD Initiated Contact with Patient 11/25/13 1506     Chief Complaint  Patient presents with  . Nausea  . Emesis  . Diarrhea  . Back Pain  . Headache     (Consider location/radiation/quality/duration/timing/severity/associated sxs/prior Treatment) HPI Pt is a 28yo male with hx of anxiety, depression, barrett esophagus, and asthma presenting to ED with c/o 3 day hx of n/v/d with associated left lower back pain and generalized body aches. Pain is constant, 7/10 at worst. Pt states he had 1 episode of non-bloody, non-bilious emesis and about 15 episodes of brown, watery diarrhea today. Reports decreased appetite with fever and chills, Tmax 101.4 yesterday.  He took 2 ibuprofen this morning with relief of fever but no relief in pain.  Reports his son was hospitalized 2 weeks ago for Shigella, no other known sick contacts or recent travel. Denies hx of abdominal surgeries. No hx of kidney stones. Does report dysuria but denies hematuria or increased frequency.    Past Medical History  Diagnosis Date  . Anxiety   . Asthma   . Depression     [place]   . Barrett esophagus   . Pneumothorax    Past Surgical History  Procedure Laterality Date  . Pleural scarification  2007, 03/26/12    x2   Family History  Problem Relation Age of Onset  . Heart attack Mother    History  Substance Use Topics  . Smoking status: Former Smoker    Types: Cigarettes    Quit date: 05/09/2010  . Smokeless tobacco: Never Used     Comment: electronic cigarette  . Alcohol Use: Yes     Comment: beer with dinner    Review of Systems  Constitutional: Positive for fever, appetite change and fatigue. Negative for chills and diaphoresis.  Respiratory: Negative for cough and shortness of breath.   Cardiovascular: Negative for chest pain and palpitations.  Gastrointestinal: Positive for nausea, vomiting, abdominal  pain ( diffuse, crampy) and diarrhea. Negative for constipation and blood in stool.  Genitourinary: Positive for dysuria. Negative for urgency, frequency, hematuria, decreased urine volume, discharge, penile pain and testicular pain.  Musculoskeletal: Positive for myalgias and back pain ( left lower). Negative for gait problem.  Neurological: Positive for weakness ( generalized).  All other systems reviewed and are negative.     Allergies  Review of patient's allergies indicates no known allergies.  Home Medications   Prior to Admission medications   Medication Sig Start Date End Date Taking? Authorizing Provider  albuterol (PROVENTIL HFA;VENTOLIN HFA) 108 (90 BASE) MCG/ACT inhaler Inhale 2 puffs into the lungs every 4 (four) hours as needed for wheezing or shortness of breath (wheezing).    Yes Historical Provider, MD  alprazolam Prudy Feeler(XANAX) 2 MG tablet Take 2 mg by mouth 2 (two) times daily.   Yes Historical Provider, MD  Cyanocobalamin (VITAMIN B-12) 1000 MCG/15ML LIQD Take 500 mcg by mouth daily.   Yes Historical Provider, MD  ibuprofen (ADVIL,MOTRIN) 200 MG tablet Take 400 mg by mouth every 6 (six) hours as needed for headache (headache).    Yes Historical Provider, MD  levocetirizine (XYZAL) 5 MG tablet Take 5 mg by mouth every morning.    Yes Historical Provider, MD  Multiple Vitamins-Minerals (MULTIVITAMIN & MINERAL PO) Take 1 tablet by mouth daily.   Yes Historical Provider, MD  omeprazole (PRILOSEC) 40 MG capsule Take 40  mg by mouth 2 (two) times daily.   Yes Historical Provider, MD  albuterol (PROVENTIL) (2.5 MG/3ML) 0.083% nebulizer solution Take 3 mLs (2.5 mg total) by nebulization every 4 (four) hours as needed for wheezing. 03/28/12   Nelwyn Salisbury, MD  aspirin-sod bicarb-citric acid (ALKA-SELTZER) 325 MG TBEF tablet Take 325 mg by mouth every 6 (six) hours as needed (congestion).    Historical Provider, MD  levocetirizine (XYZAL) 5 MG tablet TAKE 1 TABLET BY MOUTH EVERY EVENING  10/20/13   Nelwyn Salisbury, MD  menthol-cetylpyridinium (CEPACOL) 3 MG lozenge Take 1 lozenge by mouth daily as needed for pain (sore throat).    Historical Provider, MD  omeprazole (PRILOSEC) 40 MG capsule TAKE ONE CAPSULE BY MOUTH TWICE A DAY 10/25/13   Hart Carwin, MD  PROAIR HFA 108 [phone] BASE) MCG/ACT inhaler INHALE 2 PUFFS INTO THE LUNGS EVERY 4 HOURS AS NEEDED FOR WHEEZING. 10/25/13   Nelwyn Salisbury, MD  promethazine (PHENERGAN) 25 MG suppository Place 1 suppository (25 mg total) rectally every 6 (six) hours as needed for nausea. 03/31/11 04/07/11  Shari A Upstill, PA-C  promethazine (PHENERGAN) 25 MG tablet Take 1 tablet (25 mg total) by mouth every 6 (six) hours as needed for nausea or vomiting. 11/25/13   Junius Finner, PA-C   BP 126/73 mmHg  Pulse 85  Temp(Src) 98.2 F (36.8 C) (Oral)  Resp 18  SpO2 98% Physical Exam  Constitutional: He appears well-developed and well-nourished.  Pt lying in exam bed, appears uncomfortable, mildly fatigued.   HENT:  Head: Normocephalic and atraumatic.  Eyes: Conjunctivae are normal. No scleral icterus.  Neck: Normal range of motion. Neck supple.  Cardiovascular: Normal rate, regular rhythm and normal heart sounds.   Regular rate and rhythm  Pulmonary/Chest: Effort normal and breath sounds normal. No respiratory distress. He has no wheezes. He has no rales. He exhibits no tenderness.  Lungs: CTAB  Abdominal: Soft. Bowel sounds are normal. He exhibits no distension and no mass. There is no tenderness. There is no rebound and no guarding.  Soft, non-distended, non-tender. No CVAT  Musculoskeletal: Normal range of motion.  Neurological: He is alert.  Skin: Skin is warm and dry.  Nursing note and vitals reviewed.   ED Course  Procedures (including critical care time) Labs Review Labs Reviewed  COMPREHENSIVE METABOLIC PANEL - Abnormal; Notable for the following:    Glucose, Bld 101 (*)    All other components within normal limits  CBC WITH  DIFFERENTIAL - Abnormal; Notable for the following:    MCHC 36.4 (*)    Platelets 125 (*)    Neutrophils Relative % 86 (*)    Neutro Abs 8.8 (*)    Lymphocytes Relative 5 (*)    Lymphs Abs 0.5 (*)    All other components within normal limits  URINALYSIS, ROUTINE W REFLEX MICROSCOPIC - Abnormal; Notable for the following:    Color, Urine AMBER (*)    Specific Gravity, Urine 1.037 (*)    Hgb urine dipstick TRACE (*)    Bilirubin Urine SMALL (*)    Ketones, ur >80 (*)    Protein, ur 30 (*)    All other components within normal limits  STOOL CULTURE  LIPASE, BLOOD  URINE MICROSCOPIC-ADD ON    Imaging Review No results found.   EKG Interpretation None      MDM   Final diagnoses:  Nausea vomiting and diarrhea  Acute nonintractable headache, unspecified headache type    pt presenting  to ED with n/v/d with associated generalized body aches. Abdominal exam-benign. Pt appears uncomfortable and fatigued, vitals and labs: unremarkable. Due to pt's hx of 15 episodes of diarrhea today, concern for mild dehydration.  Will also send for UA and stool culture.  Tx in ED: IV fluids, toradol and zofran.  If pt feeling improved, will tx PO fluid challenge for oral rehydration.  Not concerned for SBO, appendicitis, cholecystitis, or other emergent process taking place at this time. Symptoms likely due gastroenteritis.    6:14 PM Labs: unremarkable. Pt able to keep down several ounces of PO fluids, however, pt still c/o posterior headache, 5/10, aching.  Will give migraine cocktail- IV fluids, benadryl and compazine as pt has already had toradol.    7:43 PM notified by pharmacy there is a backorder on compazine. Will give pt reglan instead.    8:37 PM  Low suspicion for shigella as no reports of bloody diarrhea and no further episodes in ED after initial 2 just upon arrival to ED 6hrs ago. Pt given reglan for his headache and states he feels much improved. Has been able to keep down several ounces  of PO fluids.  Will discharge pt home with phenergan. Home care instructions provided. Advised to f/u with PCP in 2-3 days for symptoms recheck as needed. Return precautions provided. Pt verbalized understanding and agreement with tx plan.    Junius Finnerrin O'Malley, PA-C 11/25/13 2040  Linwood DibblesJon Knapp, MD 11/25/13 2104

## 2013-11-25 NOTE — ED Notes (Signed)
Pt made aware of need for stool sample, pt informed to inform staff if pt felt able to provide stool. Pt states unable to defecate at this time.

## 2013-11-25 NOTE — ED Notes (Addendum)
Pt c/o n/v/d and generalized pain x3 days.  Reported he was febrile last night. Pain unrelieved by 2 ibuprofens taken this AM.  Stated son diagnosed with Shigella and hospitalized x2 weeks

## 2014-01-22 ENCOUNTER — Other Ambulatory Visit: Payer: Self-pay | Admitting: *Deleted

## 2014-01-22 MED ORDER — OMEPRAZOLE 40 MG PO CPDR: 40.0000 mg | DELAYED_RELEASE_CAPSULE | Freq: Two times a day (BID) | ORAL | Status: DC

## 2014-02-27 ENCOUNTER — Ambulatory Visit (INDEPENDENT_AMBULATORY_CARE_PROVIDER_SITE_OTHER): Payer: BLUE CROSS/BLUE SHIELD | Admitting: Family Medicine

## 2014-02-27 ENCOUNTER — Encounter: Payer: Self-pay | Admitting: Family Medicine

## 2014-02-27 ENCOUNTER — Encounter: Payer: Self-pay | Admitting: *Deleted

## 2014-02-27 VITALS — BP 110/78 | HR 94 | Temp 97.7°F | Ht 67.0 in | Wt 193.4 lb

## 2014-02-27 DIAGNOSIS — M7711 Lateral epicondylitis, right elbow: Secondary | ICD-10-CM

## 2014-02-27 MED ORDER — NAPROXEN 500 MG PO TABS: 500.0000 mg | ORAL_TABLET | Freq: Two times a day (BID) | ORAL | Status: DC

## 2014-02-27 NOTE — Progress Notes (Signed)
Pre visit review using our clinic review tool, if applicable. No additional management support is needed unless otherwise documented below in the visit note. 

## 2014-02-27 NOTE — Patient Instructions (Addendum)
-  do the exercises 4 days per week - otherwise avoid activities that aggravate pain   -naproxen twice daily as needed for pain  -topical sports creams may also help the pain  -follow up in 4 weeks

## 2014-02-27 NOTE — Progress Notes (Signed)
HPI:  R arm pain: -reports has had this intermittently for several years and was diagnosed with tennis elbow in the past -recently started lifting weights and has had a flare in this pain -report hx steroid injections in the past but doe snot know if this really helped much -reports: pain in L arm near elbow with certain movement, mod to severe at times -denies: weakness, numbness, radiation, malaise, fevers  ROS: See pertinent positives and negatives per HPI.  Past Medical History  Diagnosis Date  . Anxiety   . Asthma   . Depression     sees Dr. Dallas Schimke in [place]   . Barrett esophagus   . Pneumothorax     Past Surgical History  Procedure Laterality Date  . 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 Heart attack Mother     History   Social History  . Marital Status: Married    Spouse Name: N/A    Number of Children: 2  . Years of Education: N/A   Occupational History  . Package Games developer   Social History Main Topics  . Smoking status: Former Smoker    Types: Cigarettes    Quit date: 05/09/2010  . Smokeless tobacco: Never Used     Comment: electronic cigarette  . Alcohol Use: Yes     Comment: beer with dinner  . Drug Use: No  . Sexual Activity: None   Other Topics Concern  . None   Social History Narrative     Current outpatient prescriptions:  .  albuterol (PROVENTIL HFA;VENTOLIN HFA) 108 (90 BASE) MCG/ACT inhaler, Inhale 2 puffs into the lungs every 4 (four) hours as needed for wheezing or shortness of breath (wheezing). , Disp: , Rfl:  .  albuterol (PROVENTIL) (2.5 MG/3ML) 0.083% nebulizer solution, Take 3 mLs (2.5 mg total) by nebulization every 4 (four) hours as needed for wheezing., Disp: 50 mL, Rfl: 5 .  alprazolam (XANAX) 2 MG tablet, Take 2 mg by mouth 2 (two) times daily., Disp: , Rfl:  .  Cyanocobalamin (VITAMIN B-12) 1000 MCG/15ML LIQD, Take 500 mcg by mouth daily., Disp: ,  Rfl:  .  ibuprofen (ADVIL,MOTRIN) 200 MG tablet, Take 400 mg by mouth every 6 (six) hours as needed for headache (headache). , Disp: , Rfl:  .  levocetirizine (XYZAL) 5 MG tablet, TAKE 1 TABLET BY MOUTH EVERY EVENING, Disp: 90 tablet, Rfl: 1 .  Multiple Vitamins-Minerals (MULTIVITAMIN & MINERAL PO), Take 1 tablet by mouth daily., Disp: , Rfl:  .  omeprazole (PRILOSEC) 40 MG capsule, Take 1 capsule (40 mg total) by mouth 2 (two) times daily., Disp: 180 capsule, Rfl: 0 .  PROAIR HFA 108 (90 BASE) MCG/ACT inhaler, INHALE 2 PUFFS INTO THE LUNGS EVERY 4 HOURS AS NEEDED FOR WHEEZING., Disp: 25.5 each, Rfl: 3 .  naproxen (NAPROSYN) 500 MG tablet, Take 1 tablet (500 mg total) by mouth 2 (two) times daily with a meal., Disp: 30 tablet, Rfl: 0 .  promethazine (PHENERGAN) 25 MG suppository, Place 1 suppository (25 mg total) rectally every 6 (six) hours as needed for nausea., Disp: 12 each, Rfl: 0  EXAM:  Filed Vitals:   02/27/14 1549  BP: 110/78  Pulse: 94  Temp: 97.7 F (36.5 C)    Body mass index is 30.28 kg/(m^2).  GENERAL: vitals reviewed and listed above, alert, oriented, appears well hydrated and in no acute distress  HEENT: atraumatic, conjunttiva  clear, no obvious abnormalities on inspection of external nose and ears  NECK: no obvious masses on inspection  MS: moves all extremities without noticeable abnormality -normal inspection bilat UEs -TTP prox ext tendons of R forearm -normal sensation to light touch and strength in bilat upper ext, normal radial pulses  PSYCH: pleasant and cooperative, no obvious depression or anxiety  ASSESSMENT AND PLAN:  Discussed the following assessment and plan:  Tennis elbow, right  -offered letter to limit aggravating activities at work but he declined -advised to hold of on lifting, pushing, pulling with arms fro the next few weeks and work on rehab exercises -discussed risk/benefits steroids and opted to hold off on this for now, supportive  care and recs per pt instructions -follow up in 4 weeks -Patient advised to return or notify a doctor immediately if symptoms worsen or persist or new concerns arise.  Patient Instructions  -do the exercise 4 days per week - otherwise avoid activities that aggravate pain   -naproxen twice daily as needed for pain  -topical sports creams may also help  -follow up in 4 weeks     KIM, HANNAH R.

## 2014-03-07 ENCOUNTER — Ambulatory Visit (INDEPENDENT_AMBULATORY_CARE_PROVIDER_SITE_OTHER): Payer: BLUE CROSS/BLUE SHIELD | Admitting: Physician Assistant

## 2014-03-07 VITALS — BP 124/80 | HR 107 | Temp 98.0°F | Resp 17 | Ht 67.0 in | Wt 190.0 lb

## 2014-03-07 DIAGNOSIS — R0602 Shortness of breath: Secondary | ICD-10-CM

## 2014-03-07 DIAGNOSIS — J4530 Mild persistent asthma, uncomplicated: Secondary | ICD-10-CM

## 2014-03-07 DIAGNOSIS — R05 Cough: Secondary | ICD-10-CM

## 2014-03-07 DIAGNOSIS — R0981 Nasal congestion: Secondary | ICD-10-CM

## 2014-03-07 DIAGNOSIS — R059 Cough, unspecified: Secondary | ICD-10-CM

## 2014-03-07 MED ORDER — HYDROCODONE-HOMATROPINE 5-1.5 MG/5ML PO SYRP: 5.0000 mL | ORAL_SOLUTION | Freq: Three times a day (TID) | ORAL | Status: DC | PRN

## 2014-03-07 MED ORDER — IPRATROPIUM BROMIDE 0.02 % IN SOLN
0.5000 mg | Freq: Once | RESPIRATORY_TRACT | Status: AC
  Administered 2014-03-07: 0.5 mg via RESPIRATORY_TRACT

## 2014-03-07 MED ORDER — BECLOMETHASONE DIPROPIONATE 40 MCG/ACT IN AERS: 1.0000 | INHALATION_SPRAY | Freq: Two times a day (BID) | RESPIRATORY_TRACT | Status: DC

## 2014-03-07 MED ORDER — ALBUTEROL SULFATE (2.5 MG/3ML) 0.083% IN NEBU
2.5000 mg | INHALATION_SOLUTION | Freq: Once | RESPIRATORY_TRACT | Status: AC
  Administered 2014-03-07: 2.5 mg via RESPIRATORY_TRACT

## 2014-03-07 NOTE — Patient Instructions (Signed)
I don't think you have a bacterial infection at this time causing your symptoms. For the cough, please use the hycodan as needed. A humidifier in your room at night may help.  For the congestion, mucinex may help. Be sure to drink plenty of water with this. A decongestant like sudafed may help as needed for the congestion. For the runny nose, atrovent may help as needed. If allergies are playing a component, flonase may help along with your antihistamine. Continue to use the albuterol as needed. Please do one puff of the qvar inhaler into the lungs twice daily.  Please return to clinic if your symptoms don't improve over the next 4-5 days. Please come back to clinic soon for a spirometry breathing test. I am here the weekends of March 12,13th and April 9,10th.

## 2014-03-07 NOTE — Progress Notes (Signed)
Subjective:    Patient ID: Travis [name], male    DOB: 1986-01-09, 29 y.o.   MRN: [id number]  Chief Complaint  Patient presents with  . Shortness of Breath   Patient Active Problem List   Diagnosis Date Noted  . Ingrown nail 04/04/2013  . Painful legs and moving toes of left foot 04/04/2013  . Allergic rhinitis 08/25/2012  . SEBORRHEIC DERMATITIS 01/30/2010  . ATOPIC RHINITIS 03/01/2009  . ANXIETY 01/03/2008  . DEPRESSION 01/03/2008  . ASTHMA 01/03/2008  . PEPTIC ULCER DISEASE 01/03/2008   Prior to Admission medications   Medication Sig Start Date End Date Taking? Authorizing Provider  albuterol (PROVENTIL HFA;VENTOLIN HFA) 108 (90 BASE) MCG/ACT inhaler Inhale 2 puffs into the lungs every 4 (four) hours as needed for wheezing or shortness of breath (wheezing).    Yes Historical Provider, MD  albuterol (PROVENTIL) (2.5 MG/3ML) 0.083% nebulizer solution Take 3 mLs (2.5 mg total) by nebulization every 4 (four) hours as needed for wheezing. 03/28/12  Yes Nelwyn Salisbury, MD  alprazolam Prudy Feeler) 2 MG tablet Take 2 mg by mouth 2 (two) times daily.   Yes Historical Provider, MD  Cyanocobalamin (VITAMIN B-12) 1000 MCG/15ML LIQD Take 500 mcg by mouth daily.   Yes Historical Provider, MD  ibuprofen (ADVIL,MOTRIN) 200 MG tablet Take 400 mg by mouth every 6 (six) hours as needed for headache (headache).    Yes Historical Provider, MD  levocetirizine (XYZAL) 5 MG tablet TAKE 1 TABLET BY MOUTH EVERY EVENING 10/20/13  Yes Nelwyn Salisbury, MD  Multiple Vitamins-Minerals (MULTIVITAMIN & MINERAL PO) Take 1 tablet by mouth daily.   Yes Historical Provider, MD  naproxen (NAPROSYN) 500 MG tablet Take 1 tablet (500 mg total) by mouth 2 (two) times daily with a meal. 02/27/14  Yes Terressa Koyanagi, DO  omeprazole (PRILOSEC) 40 MG capsule Take 1 capsule (40 mg total) by mouth 2 (two) times daily. 01/22/14  Yes Hart Carwin, MD  PROAIR HFA 108 [phone] BASE) MCG/ACT inhaler INHALE 2 PUFFS INTO THE LUNGS EVERY 4 HOURS AS  NEEDED FOR WHEEZING. 10/25/13  Yes Nelwyn Salisbury, MD   Medications, allergies, past medical history, surgical history, family history, social history and problem list reviewed and updated.  HPI  75 yom with pmh mild intermittent asthma and pneumothorax x2 presents with congestion, cough, sob.   Hx - First spont ptx 2007, resolved with CT placement. Second spontaneous ptx 03/2012. Resolved on own while in ED. Pts surgical hx states that pt has had pleural scarification in past. Today pt states he does not recall this and I cannot find this in chart. Has had asthma since childhood. Uses albuterol prn, approx 3-4x/week for past year, even more with recent sickness. Never hospitalized with asthma. Never had oral cs.   This episode began approx one wk ago with rhinorrhea. Approx 4 days ago began having dry cough. Has been drinking honey tea, taking tussin with no relief. Cough has been worst when waking in morning and before bed at night. Mild cp after coughing spells. Has had mild sob past few days with the coughing. Using albuterol daily past few days.   Denies otalgia, fever, chills, abd pain, n/v, diarrhea, trouble swallowing. Denies unilateral leg pain, swelling.   Hx allergies. Takes levo cetirizine so well controlled. Hx gerd. Takes prilosec and well controlled.   HR elevated to 107 today, vitals otherwise normal.   Review of Systems See HPI.     Objective:   Physical  Exam  Constitutional: He is oriented to person, place, and time. He appears well-developed and well-nourished.  Non-toxic appearance. He does not have a sickly appearance. He does not appear ill. No distress.  BP 124/80 mmHg  Pulse 107  Temp(Src) 98 F (36.7 C) (Oral)  Resp 17  Ht 5\' 7"  (1.702 m)  Wt 190 lb (86.183 kg)  BMI 29.75 kg/m2  SpO2 99%  PF 300 L/min   HENT:  Right Ear: Tympanic membrane is not erythematous. A middle ear effusion is present.  Left Ear: Tympanic membrane is not erythematous. A middle ear  effusion is present.  Nose: Mucosal edema and rhinorrhea present. Right sinus exhibits no maxillary sinus tenderness and no frontal sinus tenderness. Left sinus exhibits no maxillary sinus tenderness and no frontal sinus tenderness.  Mouth/Throat: Uvula is midline, oropharynx is clear and moist and mucous membranes are normal. No oropharyngeal exudate, posterior oropharyngeal edema, posterior oropharyngeal erythema or tonsillar abscesses.  Neck: Trachea normal. No JVD present. No tracheal deviation present.  Cardiovascular: Normal rate, regular rhythm and normal heart sounds.  Exam reveals no gallop.   No murmur heard. Pulmonary/Chest: Effort normal and breath sounds normal. No accessory muscle usage. No tachypnea. No respiratory distress. He has no decreased breath sounds. He has no wheezes. He has no rhonchi. He has no rales.  Lymphadenopathy:       Head (right side): No submental, no submandibular and no tonsillar adenopathy present.       Head (left side): No submental, no submandibular and no tonsillar adenopathy present.    He has no cervical adenopathy.  Neurological: He is alert and oriented to person, place, and time.  Skin: No rash noted.  Psychiatric: He has a normal mood and affect. His speech is normal and behavior is normal.   Peak flow predicted: 600 Peak flow monitoring pre duoneb: 400  Pt had one albuterol/ipratroprium duoneb.  Peak flow monitoring post duoneb: 475 Lung sounds post duoneb: Normal. No change from pre breathing tx.      Assessment & Plan:   [age] yom with pmh mild intermittent asthma and pneumothorax x2 presents with congestion, cough, sob.   Shortness of breath - Plan: albuterol (PROVENTIL) (2.5 MG/3ML) 0.083% nebulizer solution 2.5 mg, ipratropium (ATROVENT) nebulizer solution 0.5 mg --felt slightly better after duoneb --resp rate, o2 sat normal --normal exam, not sob in clinic --most likely viral uri or asthma contributing, doubt bacterial etiology at  this time  Cough - Plan: HYDROcodone-homatropine (HYCODAN) 5-1.5 MG/5ML syrup  Head congestion --mucinex, decongestant, flonase, continue home antihistamine  Mild persistent asthma in adult without complication - Plan: beclomethasone (QVAR) 40 MCG/ACT inhaler --poorly controlled asthma could be contributing to cough --asthma is most likely mild persistent at this point as pt has been using albuterol inhaler 3-4x/week for at least one year, has used daily more recently --start qvar bid --decrease albuterol to prn --pt to rtc to see me 1-2 months for spirometry and see how doing with qvar  Donnajean Lopesodd M. Ysidra Sopher, PA-C Physician Assistant-Certified [facility]  03/07/2014 5:53 PM

## 2014-03-08 NOTE — Progress Notes (Signed)
  Medical screening examination/treatment/procedure(s) were performed by non-physician practitioner and as supervising physician I was immediately available for consultation/collaboration.     

## 2014-03-15 ENCOUNTER — Ambulatory Visit (INDEPENDENT_AMBULATORY_CARE_PROVIDER_SITE_OTHER): Payer: BLUE CROSS/BLUE SHIELD | Admitting: Urgent Care

## 2014-03-15 VITALS — BP 116/74 | HR 112 | Temp 98.4°F | Resp 18 | Ht 68.0 in | Wt 187.0 lb

## 2014-03-15 DIAGNOSIS — R1030 Lower abdominal pain, unspecified: Secondary | ICD-10-CM

## 2014-03-15 DIAGNOSIS — R51 Headache: Secondary | ICD-10-CM

## 2014-03-15 DIAGNOSIS — R519 Headache, unspecified: Secondary | ICD-10-CM

## 2014-03-15 DIAGNOSIS — A084 Viral intestinal infection, unspecified: Secondary | ICD-10-CM

## 2014-03-15 DIAGNOSIS — R112 Nausea with vomiting, unspecified: Secondary | ICD-10-CM

## 2014-03-15 LAB — POCT CBC
Granulocyte percent: 84.9 %G — AB (ref 37–80)
HEMATOCRIT: 50.6 % (ref 43.5–53.7)
HEMOGLOBIN: 17.2 g/dL (ref 14.1–18.1)
LYMPH, POC: 0.8 (ref 0.6–3.4)
MCH: 30.1 pg (ref 27–31.2)
MCHC: 33.9 g/dL (ref 31.8–35.4)
MCV: 88.6 fL (ref 80–97)
MID (cbc): 0.5 (ref 0–0.9)
MPV: 8.5 fL (ref 0–99.8)
POC Granulocyte: 7.4 — AB (ref 2–6.9)
POC LYMPH %: 9.5 % — AB (ref 10–50)
POC MID %: 5.6 % (ref 0–12)
Platelet Count, POC: 172 10*3/uL (ref 142–424)
RBC: 5.71 M/uL (ref 4.69–6.13)
RDW, POC: 12.3 %
WBC: 8.7 10*3/uL (ref 4.6–10.2)

## 2014-03-15 LAB — COMPREHENSIVE METABOLIC PANEL
ALK PHOS: 62 U/L (ref 39–117)
ALT: 18 U/L (ref 0–53)
AST: 24 U/L (ref 0–37)
Albumin: 5 g/dL (ref 3.5–5.2)
BUN: 16 mg/dL (ref 6–23)
CO2: 23 mEq/L (ref 19–32)
CREATININE: 0.93 mg/dL (ref 0.50–1.35)
Calcium: 9.2 mg/dL (ref 8.4–10.5)
Chloride: 98 mEq/L (ref 96–112)
Glucose, Bld: 87 mg/dL (ref 70–99)
POTASSIUM: 3.7 meq/L (ref 3.5–5.3)
Sodium: 137 mEq/L (ref 135–145)
Total Bilirubin: 1.3 mg/dL — ABNORMAL HIGH (ref 0.2–1.2)
Total Protein: 7.3 g/dL (ref 6.0–8.3)

## 2014-03-15 MED ORDER — ONDANSETRON 8 MG PO TBDP: 8.0000 mg | ORAL_TABLET | Freq: Three times a day (TID) | ORAL | Status: DC | PRN

## 2014-03-15 NOTE — Patient Instructions (Signed)

## 2014-03-15 NOTE — Progress Notes (Signed)
MRN: [id number] DOB: 1985/04/27  Subjective:   Travis [name] is a 29 y.o. male presenting for chief complaint of Emesis; Diarrhea; and Nausea  Reports 1 day history of nausea, vomiting, diarrhea, stomach cramps, malaise and weakness, headache. Has vomitted 15x, diarrhea 20x very watery since yesterday. Patient's children had same symptoms a couple of days ago, diagnosed with GI virus and had to stay at home with patient to get better. Denies fevers, chest pain, shob, blood in stool or vomit. Has tried to stay hydrated with water and Gatorade but has not been able to eat anything. No smoking, occasional alcohol. Denies any other aggravating or relieving factors, no other questions or concerns.  Travis [name] has a current medication list which includes the following prescription(s): albuterol, alprazolam, beclomethasone, vitamin b-12, hydrocodone-homatropine, ibuprofen, levocetirizine, multiple vitamins-minerals, naproxen, omeprazole, and proair hfa.   has No Known Allergies.  Travis [name]  has a past medical history of Anxiety; Asthma; Depression; Barrett esophagus; and Pneumothorax. Also  has past surgical history that includes Pleural scarification (2007, 03/26/12).  ROS As in subjective.  Objective:   Vitals: BP 116/74 mmHg  Pulse 112  Temp(Src) 98.4 F (36.9 C) (Oral)  Resp 18  Ht  (1.727 m)  Wt 187 lb (84.823 kg)  BMI 28.44 kg/m2  SpO2 97%  Pulse 88 on recheck by PA-Alistar Mcenery at 16:25, 03/15/2014  Physical Exam  Constitutional: He is oriented to person, place, and time and well-developed, well-nourished, and in no distress.  HENT:  Mucous membranes dry.  Cardiovascular: Normal rate, regular rhythm and intact distal pulses.  Exam reveals no gallop and no friction rub.   No murmur heard. Pulmonary/Chest: No respiratory distress. He has no wheezes. He has no rales. He exhibits no tenderness.  Abdominal: Soft. Bowel sounds are normal. He exhibits no distension and no mass. There is  tenderness. There is no guarding. Rebound: lower abdomen.  Neurological: He is alert and oriented to person, place, and time.  Skin: Skin is warm and dry. No rash noted. No erythema. No pallor.   Results for orders placed or performed in visit on 03/15/14 (from the past 24 hour(s))  POCT CBC     Status: Abnormal   Collection Time: 03/15/14  4:12 PM  Result Value Ref Range   WBC 8.7 4.6 - 10.2 K/uL   Lymph, poc 0.8 0.6 - 3.4   POC LYMPH PERCENT 9.5 (A) 10 - 50 %L   MID (cbc) 0.5 0 - 0.9   POC MID % 5.6 0 - 12 %M   POC Granulocyte 7.4 (A) 2 - 6.9   Granulocyte percent 84.9 (A) 37 - 80 %G   RBC 5.71 4.69 - 6.13 M/uL   Hemoglobin 17.2 14.1 - 18.1 g/dL   HCT, POC 24.4 01.0 - 53.7 %   MCV 88.6 80 - 97 fL   MCH, POC 30.1 27 - 31.2 pg   MCHC 33.9 31.8 - 35.4 g/dL   RDW, POC 27.2 %   Platelet Count, POC 172 142 - 424 K/uL   MPV 8.5 0 - 99.8 fL   Assessment and Plan :   1. Non-intractable vomiting with nausea, vomiting of unspecified type 2. Lower abdominal pain 3. Nonintractable headache, unspecified chronicity pattern, unspecified headache type 4. Viral gastroenteritis - Advised aggressive hydration, zofran for nausea and vomiting, continue clear liquids. If still symptomatic in 5 days advised to return to clinic for re-evaluation.  Wallis Bamberg, PA-C [facility]  [phone] 03/15/2014 4:34 PM

## 2014-03-16 NOTE — Progress Notes (Signed)
Called to check on him on 2/26: LMOM.  Please let me know if not improving as it sounds like his sx were fairly severe.  Please follow-up if not better in 1-2 days

## 2014-03-18 ENCOUNTER — Encounter: Payer: Self-pay | Admitting: Urgent Care

## 2014-04-23 ENCOUNTER — Other Ambulatory Visit: Payer: Self-pay | Admitting: Family Medicine

## 2014-05-23 ENCOUNTER — Telehealth: Payer: Self-pay | Admitting: Family Medicine

## 2014-05-23 MED ORDER — LEVOCETIRIZINE DIHYDROCHLORIDE 5 MG PO TABS: 5.0000 mg | ORAL_TABLET | Freq: Every evening | ORAL | Status: DC

## 2014-05-23 NOTE — Telephone Encounter (Signed)
Patient states he lost the whole bottle of levocetirizine (XYZAL) 5 MG tablet.  He would like to know if Dr. Clent RidgesFry will approve another rx and send it to [id number] - [place], [place] - [address]

## 2014-05-23 NOTE — Telephone Encounter (Signed)
Per Dr. Clent RidgesFry okay to resend script. I did send script e-scribe and left a voice message for pt.

## 2014-06-10 ENCOUNTER — Other Ambulatory Visit: Payer: Self-pay | Admitting: Internal Medicine

## 2014-06-11 ENCOUNTER — Telehealth: Payer: Self-pay | Admitting: Internal Medicine

## 2014-06-11 MED ORDER — OMEPRAZOLE 40 MG PO CPDR: DELAYED_RELEASE_CAPSULE | ORAL | Status: DC

## 2014-06-11 NOTE — Telephone Encounter (Signed)
Sent Rx for omeprazole (PRILOSEC), 40 mg, #90 with no refills to [facility]. Pt must keep appointment with Dr. Juanda ChanceBrodie on 07/31/14 at 10:15 am for future refills.

## 2014-06-11 NOTE — Addendum Note (Signed)
Addended by: Fritz PickerelMAVRAKIS, Chanita Boden A on: 06/11/2014 10:12 AM   Modules accepted: Orders

## 2014-06-11 NOTE — Telephone Encounter (Signed)
Pt wanted 90-day supply due to his insurance. Re-sent Rx, #180 with no refills. Called pharmacy to let them know made mistake. Pt must keep appt with Dr. Juanda ChanceBrodie on 07/31/14 for future refills.

## 2014-07-02 ENCOUNTER — Encounter: Payer: BLUE CROSS/BLUE SHIELD | Admitting: Family Medicine

## 2014-07-10 ENCOUNTER — Telehealth: Payer: Self-pay | Admitting: Family Medicine

## 2014-07-10 ENCOUNTER — Encounter: Payer: BLUE CROSS/BLUE SHIELD | Admitting: Family Medicine

## 2014-07-10 DIAGNOSIS — Z0289 Encounter for other administrative examinations: Secondary | ICD-10-CM

## 2014-07-10 NOTE — Telephone Encounter (Signed)
Pt was on schedule for a CPE today, I called and left a voice message for pt to reschedule this appointment.

## 2014-07-25 ENCOUNTER — Other Ambulatory Visit: Payer: Self-pay | Admitting: Family Medicine

## 2014-07-31 ENCOUNTER — Ambulatory Visit (INDEPENDENT_AMBULATORY_CARE_PROVIDER_SITE_OTHER): Payer: BLUE CROSS/BLUE SHIELD | Admitting: Internal Medicine

## 2014-07-31 ENCOUNTER — Encounter: Payer: Self-pay | Admitting: Internal Medicine

## 2014-07-31 VITALS — BP 110/70 | HR 88 | Ht 66.5 in | Wt 189.2 lb

## 2014-07-31 DIAGNOSIS — K2270 Barrett's esophagus without dysplasia: Secondary | ICD-10-CM

## 2014-07-31 DIAGNOSIS — K219 Gastro-esophageal reflux disease without esophagitis: Secondary | ICD-10-CM | POA: Diagnosis not present

## 2014-07-31 MED ORDER — OMEPRAZOLE 40 MG PO CPDR: DELAYED_RELEASE_CAPSULE | ORAL | Status: DC

## 2014-07-31 NOTE — Patient Instructions (Addendum)
You have been scheduled for an endoscopy. Please follow written instructions given to you at your visit today. If you use inhalers (even only as needed), please bring them with you on the day of your procedure. Your physician has requested that you go to www.startemmi.com and enter the access code given to you at your visit today. This web site gives a general overview about your procedure. However, you should still follow specific instructions given to you by our office regarding your preparation for the procedure.  We have sent medications to your pharmacy for you to pick up at your convenience.  Get OTC Gaviscon and take 1-2 every 4 hours as needed.

## 2014-07-31 NOTE — Progress Notes (Signed)
Travis [name] 04-20-1985 [id number]  Note: This dictation was prepared with Dragon digital system. Any transcriptional errors that result from this procedure are unintentional.   History of Present Illness: This is a  29 year old white male with chronic gastroesophageal reflux disease and history of Barrett's esophagus documented on  upper endoscopy in 2009. Biopsies showed metaplasia, goblet cells and erosive esophagitis. He since then has stopped smoking and at reduced his alcohol intake to 2 beers a day.Marland Kitchen. He has had recent exacerbation of heartburn and a sharp pain along left costal margin. CT scan of the abdomen in the emergency room was negative. He is here to refill omeprazole and evaluate the pain.    Past Medical History  Diagnosis Date  . Anxiety   . Asthma   . Depression     [place] Dr. [place]eth [place]   . Barrett esophagus   . Pneumothorax     Past Surgical History  Procedure Laterality Date  . 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  Review of Systems: Denies dysphagia. Or chest pain  The remainder of the 10 point ROS is negative except as outlined in the H&P  Physical Exam: General Appearance Well developed, in no distress Eyes  Non icteric  HEENT  Non traumatic, normocephalic  Mouth No lesion, tongue papillated, no cheilosis Neck Supple without adenopathy, thyroid not enlarged, no carotid bruits, no JVD Lungs Clear to auscultation bilaterally COR Normal S1, normal S2, regular rhythm, no murmur, quiet precordium Abdomen soft nontender with normoactive bowel sounds. No distention. Liver edge at costal margin Rectal not done Extremities  No pedal edema Skin No lesions Neurological Alert and oriented x 3 Psychological Normal mood and affect  Assessment and Plan:   29 year old white male with history of Barrett's esophagus due for recall endoscopy which was supposed  to be due in 2011. Takes omeprazole 40 mg a day with the insufficient control of his symptoms. We will increase omeprazole to 40 mg twice a day and add Gaviscon 1-2 tablets every 4 hours when necessary breakthrough symptoms. He will be scheduled for upper endoscopy and biopsies to assess his abdominal pain as well as to rule out dysplasia    Lina SarDora Davida Falconi 07/31/2014

## 2014-08-01 ENCOUNTER — Ambulatory Visit (AMBULATORY_SURGERY_CENTER): Payer: BLUE CROSS/BLUE SHIELD | Admitting: Internal Medicine

## 2014-08-01 ENCOUNTER — Encounter: Payer: Self-pay | Admitting: Internal Medicine

## 2014-08-01 VITALS — BP 124/78 | HR 84 | Temp 98.1°F | Resp 35 | Ht 66.0 in | Wt 189.0 lb

## 2014-08-01 DIAGNOSIS — K2270 Barrett's esophagus without dysplasia: Secondary | ICD-10-CM | POA: Diagnosis not present

## 2014-08-01 DIAGNOSIS — K219 Gastro-esophageal reflux disease without esophagitis: Secondary | ICD-10-CM

## 2014-08-01 MED ORDER — SODIUM CHLORIDE 0.9 % IV SOLN: 500.0000 mL | INTRAVENOUS | Status: DC

## 2014-08-01 MED ORDER — DICYCLOMINE HCL 10 MG PO CAPS: ORAL_CAPSULE | ORAL | Status: DC

## 2014-08-01 NOTE — Patient Instructions (Signed)
discharge instructions given. Biopsies taken. Resume previous medications. Prescription sent into pharmacy. YOU HAD AN ENDOSCOPIC PROCEDURE TODAY AT THE [facility]:   Refer to the procedure report that was given to you for any specific questions about what was found during the examination.  If the procedure report does not answer your questions, please call your gastroenterologist to clarify.  If you requested that your care partner not be given the details of your procedure findings, then the procedure report has been included in a sealed envelope for you to review at your convenience later.  YOU SHOULD EXPECT: Some feelings of bloating in the abdomen. Passage of more gas than usual.  Walking can help get rid of the air that was put into your GI tract during the procedure and reduce the bloating. If you had a lower endoscopy (such as a colonoscopy or flexible sigmoidoscopy) you may notice spotting of blood in your stool or on the toilet paper. If you underwent a bowel prep for your procedure, you may not have a normal bowel movement for a few days.  Please Note:  You might notice some irritation and congestion in your nose or some drainage.  This is from the oxygen used during your procedure.  There is no need for concern and it should clear up in a day or so.  SYMPTOMS TO REPORT IMMEDIATELY:   Following upper endoscopy (EGD)  Vomiting of blood or coffee ground material  New chest pain or pain under the shoulder blades  Painful or persistently difficult swallowing  New shortness of breath  Fever of 100F or higher  Black, tarry-looking stools  For urgent or emergent issues, a gastroenterologist can be reached at any hour by calling [phone].   DIET: Your first meal following the procedure should be a small meal and then it is ok to progress to your normal diet. Heavy or fried foods are harder to digest and may make you feel nauseous or bloated.  Likewise, meals heavy in  dairy and vegetables can increase bloating.  Drink plenty of fluids but you should avoid alcoholic beverages for 24 hours.  ACTIVITY:  You should plan to take it easy for the rest of today and you should NOT DRIVE or use heavy machinery until tomorrow (because of the sedation medicines used during the test).    FOLLOW UP: Our staff will call the number listed on your records the next business day following your procedure to check on you and address any questions or concerns that you may have regarding the information given to you following your procedure. If we do not reach you, we will leave a message.  However, if you are feeling well and you are not experiencing any problems, there is no need to return our call.  We will assume that you have returned to your regular daily activities without incident.  If any biopsies were taken you will be contacted by phone or by letter within the next 1-3 weeks.  Please call us at [phone] if you have not heard about the biopsies in 3 weeks.    SIGNATURES/CONFIDENTIALITY: You and/or your care partner have signed paperwork which will be entered into your electronic medical record.  These signatures attest to the fact that that the information above on your After Visit Summary has been reviewed and is understood.  Full responsibility of the confidentiality of this discharge information lies with you and/or your care-partner.

## 2014-08-01 NOTE — Progress Notes (Signed)
Called to room to assist during endoscopic procedure.  Patient ID and intended procedure confirmed with present staff. Received instructions for my participation in the procedure from the performing physician.  

## 2014-08-01 NOTE — Op Note (Signed)
[facility] [address] [place], [id number]   ENDOSCOPY PROCEDURE REPORT  PATIENT: Travis [name], Travis [name]  MR#: [id number] BIRTHDATE: 09/06/1985 , 29  yrs. old GENDER: male ENDOSCOPIST: Hart Carwinora M Brodie, MD REFERRED BY:  Gershon CraneStephen Fry, M.D. PROCEDURE DATE:  08/01/2014 PROCEDURE:  EGD w/ biopsy ASA CLASS:     Class II INDICATIONS:  history of Barrett's esophagus and Barrett's esophagus diagnosed on upper endoscopy in 2009.  Increased symptoms of reflux despite of PPIs. MEDICATIONS: Monitored anesthesia care and Propofol 250 mg IV TOPICAL ANESTHETIC: none  DESCRIPTION OF PROCEDURE: After the risks benefits and alternatives of the procedure were thoroughly explained, informed consent was obtained.  The LB [id number] endoscope was introduced through the mouth and advanced to the second portion of the duodenum , Without limitations.  The instrument was slowly withdrawn as the mucosa was fully examined.      ESOPHAGUS: The mucosa of the esophagus appeared normal. squamocolumnar junction was slightly irregular but there was no acute esophagitis area there was no hiatal hernia. biopsies were obtained from squamocolumnar junction to rule out Barrett's esophagus  STOMACH: The mucosa of the stomach appeared normal. ,gastric outlet was normal. Retroflexion of endoscope revealed normal fundus and cardia there was no gastritis  DUODENUM: The duodenal mucosa showed no abnormalities in the bulb and 2nd part of the duodenum.  Re The scope was then withdrawn from the patient and the procedure completed.  COMPLICATIONS: There were no immediate complications.  ENDOSCOPIC IMPRESSION: 1.   The mucosa of the esophagus appeared normal ,mildly irregular squamocolumnar junction. Status post biopsies area no evidence of acute esophagitis 2.   The mucosa of the stomach appeared normal 3.   The duodenal mucosa showed no abnormalities in the bulb and 2nd part of the  duodenum  RECOMMENDATIONS: 1.  Await biopsy results 2.  Anti-reflux regimen to be follow 3.  Continue PPI, omeprazole 40 mg twice [name] day 4. continue Gaviscon 1-2 tabs q 3-4 hours prn hearburn 5. Recall upper endoscopy pending path report  REPEAT EXAM:  eSigned:  Hart Carwinora M Brodie, MD 08/01/2014 11:48 AM    CC:  PATIENT NAME:  Travis [name], Travis [name] MR#: [id number]

## 2014-08-01 NOTE — Progress Notes (Signed)
Stable to RR 

## 2014-08-01 NOTE — Progress Notes (Signed)
Detailed work note done- pt stated ok for work note to state what procedure was done and that he may not drive or use heavy machinery today

## 2014-08-02 ENCOUNTER — Telehealth: Payer: Self-pay | Admitting: Emergency Medicine

## 2014-08-02 NOTE — Telephone Encounter (Signed)
No identifier, left message f/u 

## 2014-08-06 ENCOUNTER — Encounter: Payer: Self-pay | Admitting: Internal Medicine

## 2014-08-06 ENCOUNTER — Other Ambulatory Visit: Payer: Self-pay

## 2014-08-06 DIAGNOSIS — J4530 Mild persistent asthma, uncomplicated: Secondary | ICD-10-CM

## 2014-08-06 DIAGNOSIS — K219 Gastro-esophageal reflux disease without esophagitis: Secondary | ICD-10-CM

## 2014-08-06 MED ORDER — DICYCLOMINE HCL 10 MG PO CAPS: ORAL_CAPSULE | ORAL | Status: DC

## 2014-08-20 ENCOUNTER — Telehealth: Payer: Self-pay | Admitting: Internal Medicine

## 2014-08-20 NOTE — Telephone Encounter (Signed)
Left message for pt to call back  °

## 2014-08-20 NOTE — Telephone Encounter (Signed)
Pt states he has been having problems with LLQ abd pain and some constipation. Pt thinks he may need to have a colon done to evaluate. Pt scheduled to see Dr. Juanda Chance. 08/29/14@1 :45pm. Pt aware of appt.

## 2014-08-29 ENCOUNTER — Ambulatory Visit: Payer: BLUE CROSS/BLUE SHIELD | Admitting: Internal Medicine

## 2014-10-18 ENCOUNTER — Telehealth: Payer: Self-pay | Admitting: *Deleted

## 2014-10-18 NOTE — Telephone Encounter (Signed)
Lm for the patient today to call our office. I LM we got a fax from [facility], [place]. For Omeprazole 40 mg.  He will need to call our office and choose another MD and make a future appointment before we can fill this prescription.  I left information for him about Dr. Ileene Patrick and Dr. Lavon Paganini.

## 2014-10-19 ENCOUNTER — Other Ambulatory Visit: Payer: Self-pay | Admitting: *Deleted

## 2014-10-19 NOTE — Telephone Encounter (Signed)
Patient requests refills of omeprazole. I have left a message on patient's mobile (ok per ROI) that Dr Juanda Chance has retired and he needs to schedule to see another one of our providers prior to getting refills.

## 2014-11-05 ENCOUNTER — Telehealth: Payer: Self-pay | Admitting: *Deleted

## 2014-11-05 NOTE — Telephone Encounter (Signed)
    Left a message for the patient to advise to call our office regarding a refill request for Omeprazole 40 mg.  Advised since Dr. Juanda ChanceBrodie retired he will have to call our office to choose another MD. Left information about Dr. Adela LankArmbruster and Dr. Lavon PaganiniNandigam.

## 2015-01-22 ENCOUNTER — Telehealth: Payer: Self-pay | Admitting: *Deleted

## 2015-01-22 ENCOUNTER — Encounter: Payer: Self-pay | Admitting: *Deleted

## 2015-01-22 MED ORDER — OMEPRAZOLE 40 MG PO CPDR: 40.0000 mg | DELAYED_RELEASE_CAPSULE | Freq: Every day | ORAL | Status: DC

## 2015-01-22 NOTE — Telephone Encounter (Signed)
[facility] Rx was originally sent in error to pharmacy. I have contacted pharmacy to advise that rx be denied. Patient actually needs an appointment.

## 2015-03-26 ENCOUNTER — Telehealth: Payer: Self-pay | Admitting: Gastroenterology

## 2015-03-26 MED ORDER — OMEPRAZOLE 40 MG PO CPDR: 40.0000 mg | DELAYED_RELEASE_CAPSULE | Freq: Every day | ORAL | Status: DC

## 2015-03-26 NOTE — Telephone Encounter (Signed)
1 refill sent to last until appt is kept.

## 2015-04-30 ENCOUNTER — Ambulatory Visit (INDEPENDENT_AMBULATORY_CARE_PROVIDER_SITE_OTHER): Payer: BLUE CROSS/BLUE SHIELD | Admitting: Gastroenterology

## 2015-04-30 ENCOUNTER — Encounter: Payer: Self-pay | Admitting: Gastroenterology

## 2015-04-30 VITALS — BP 114/64 | HR 96 | Ht 66.5 in | Wt 181.2 lb

## 2015-04-30 DIAGNOSIS — K219 Gastro-esophageal reflux disease without esophagitis: Secondary | ICD-10-CM

## 2015-04-30 MED ORDER — OMEPRAZOLE 40 MG PO CPDR: 40.0000 mg | DELAYED_RELEASE_CAPSULE | Freq: Every day | ORAL | Status: DC

## 2015-04-30 NOTE — Patient Instructions (Signed)
Follow up as needed.  Thank you for choosing [facility]  Dr Henry Danis III  

## 2015-04-30 NOTE — Progress Notes (Signed)
[facility] Progress Note  Chief Complaint: GERD  Subjective History: Travis [name] saw Travis [name] for several years, last office visit July 2016. He previously had bleeding ulcers by his report. He also is chronic heartburn for which she takes Prilosec 40 mg once daily. If he misses a day gets Travis [name] almost immediately. He had a history of reported Barrett's esophagus in 2009, but none was seen on EGD in July 2016. He is here for follow-up and to establish care with you provider. He has felt fairly well these days, and he is changed to E cigarettes and is cutting down the nicotine dose. He denies early satiety, nausea, vomiting, or weight loss. ROS: Cardiovascular:  no chest pain Respiratory: no dyspnea  The patient's Past Medical, Family and Social History were reviewed and are on file in the EMR.  Objective:  Med list reviewed  Vital signs in last 24 hrs: Filed Vitals:   04/30/15 1536  BP: 114/64  Pulse: 96    Physical Exam   HEENT: sclera anicteric, oral mucosa moist without lesions  Neck: supple, no thyromegaly, JVD or lymphadenopathy  Cardiac: RRR without murmurs, S1S2 heard, no peripheral edema  Pulm: clear to auscultation bilaterally, normal RR and effort noted  Abdomen: soft, No tenderness, with active bowel sounds. No guarding or palpable hepatosplenomegaly.  Skin; warm and dry, no jaundice or rash     @ASSESSMENTPLANBEGIN @ Assessment: Encounter Diagnosis  Name Primary?  . Gastroesophageal reflux disease without esophagitis Yes   I think there is also an element of visceral hypersensitivity.   Plan: As he cuts down on the nicotine use, I have asked him to try and cut down on the Prilosec dose, perhaps changing to every other day therapy for a while. I refilled his meds and we'll see him in a year or sooner as needed. I offered him the option of having his PCP prescribed med for his convenience, but he would like to "check in with us" at least once a  year.   Travis [name]

## 2015-07-17 ENCOUNTER — Telehealth: Payer: Self-pay | Admitting: Gastroenterology

## 2015-07-17 MED ORDER — OMEPRAZOLE 40 MG PO CPDR: 40.0000 mg | DELAYED_RELEASE_CAPSULE | Freq: Two times a day (BID) | ORAL | Status: DC

## 2015-07-17 NOTE — Telephone Encounter (Signed)
Pt requested refill of omeprazole he states he takes it twice daily as per Dr Juanda ChanceBrodie.  Prescription sent

## 2016-06-30 ENCOUNTER — Other Ambulatory Visit: Payer: Self-pay | Admitting: Gastroenterology

## 2016-07-01 NOTE — Telephone Encounter (Signed)
Please call him.  In the best interest of his medical care, I need to see him in clinic sometime in next 2 months if I am to refill his meds. He did not set an appt when last refill requested in June 2017  When appointment set, then refill this med for 2 months.  If appt not kept, no further refills from me.  Or he can see PCP for this if more convenient for him.

## 2016-07-01 NOTE — Telephone Encounter (Signed)
Patient last seen 04-2015. No current follow up. Requesting refills on omeprazole 40mg  BID

## 2016-07-01 NOTE — Telephone Encounter (Signed)
Follow up O/V made for 08-18-2016 @ 145 pm. Pt is aware that he must come to the appt for any additional refills.

## 2016-07-01 NOTE — Telephone Encounter (Signed)
Left a message to return call.  

## 2016-08-18 ENCOUNTER — Ambulatory Visit (INDEPENDENT_AMBULATORY_CARE_PROVIDER_SITE_OTHER): Payer: BLUE CROSS/BLUE SHIELD | Admitting: Gastroenterology

## 2016-08-18 ENCOUNTER — Encounter: Payer: Self-pay | Admitting: Gastroenterology

## 2016-08-18 ENCOUNTER — Encounter (INDEPENDENT_AMBULATORY_CARE_PROVIDER_SITE_OTHER): Payer: Self-pay

## 2016-08-18 VITALS — BP 126/82 | HR 96 | Ht 66.5 in | Wt 195.0 lb

## 2016-08-18 DIAGNOSIS — K219 Gastro-esophageal reflux disease without esophagitis: Secondary | ICD-10-CM

## 2016-08-18 MED ORDER — OMEPRAZOLE 40 MG PO CPDR: 40.0000 mg | DELAYED_RELEASE_CAPSULE | Freq: Two times a day (BID) | ORAL | 3 refills | Status: DC

## 2016-08-18 MED ORDER — SUCRALFATE 1 GM/10ML PO SUSP: 1.0000 g | Freq: Four times a day (QID) | ORAL | 1 refills | Status: DC

## 2016-08-18 NOTE — Patient Instructions (Addendum)
If you are age 30 or older, your body mass index should be between 23-30. Your Body mass index is 31 kg/m. If this is out of the aforementioned range listed, please consider follow up with your Primary Care Provider.  If you are age 564 or younger, your body mass index should be between 19-25. Your Body mass index is 31 kg/m. If this is out of the aformentioned range listed, please consider follow up with your Primary Care Provider.   We have sent the following medications to your pharmacy for you to pick up at your convenience: Omeprazole   Thank you for choosing [facility]  Dr Amada JupiterHenry Danis III

## 2016-08-18 NOTE — Progress Notes (Signed)
     [facility] Progress Note  Chief Complaint: GERD  Subjective  History:  Travis [name] sees me for the first time in just over a year. Travis [name] has years of chronic heartburn for which she takes twice daily omeprazole. Travis [name] ran out a few weeks ago and symptoms have worsened. Even on the twice-daily dose Travis [name] had some breakthrough symptoms several times a week, but also continues to use electronic cigarettes. Travis [name] also admits dietary indiscretions, at least partially because Travis [name] works 2 jobs. Travis [name] denies dysphagia, nausea, vomiting, early satiety or weight loss.  ROS: Cardiovascular:  no chest pain Respiratory: no dyspnea  The patient's Past Medical, Family and Social History were reviewed and are on file in the EMR.  Objective:  Med list reviewed  Vital signs in last 24 hrs: Vitals:   08/18/16 1358  BP: 126/82  Pulse: 96    Physical Exam    HEENT: sclera anicteric, oral mucosa moist without lesions  Neck: supple, no thyromegaly, JVD or lymphadenopathy  Cardiac: RRR without murmurs, S1S2 heard, no peripheral edema  Pulm: clear to auscultation bilaterally, normal RR and effort noted  Abdomen: soft, No tenderness, with active bowel sounds. No guarding or palpable hepatosplenomegaly.  Skin; warm and dry, no jaundice or rash   @ASSESSMENTPLANBEGIN @ Assessment: Encounter Diagnosis  Name Primary?  . Gastroesophageal reflux disease without esophagitis Yes   GERD with a probable element of visceral hypersensitivity and exacerbation bite nicotine per   Plan:  Travis [name] will continue twice daily omeprazole, but I also started Carafate 3 times a day after meals in hopes that Travis [name] might be able to decrease his PPI to once daily. Travis [name] will try that for a month and then call to speak with my nurse and let me know if there was any progress.  Total time 20 minutes, over half spent in counseling and coordination of care.   Travis [name]

## 2016-08-27 ENCOUNTER — Telehealth: Payer: Self-pay

## 2016-08-27 NOTE — Telephone Encounter (Signed)
Pt seen on 08-18-2016. Was given omeprazole 40 mg BID. #60 with 3 refills. He is now requesting a 90 days supply. Okay to change RX?

## 2016-08-27 NOTE — Telephone Encounter (Signed)
Yes, that would be fine, thanks!

## 2016-08-28 MED ORDER — OMEPRAZOLE 40 MG PO CPDR: 40.0000 mg | DELAYED_RELEASE_CAPSULE | Freq: Two times a day (BID) | ORAL | 3 refills | Status: DC

## 2016-08-28 NOTE — Telephone Encounter (Signed)
New Rx has been sent for a 90 days supply as directed.

## 2017-02-26 MED FILL — ALPRAZolam 2 MG TABS: 2 | 30 days supply | Qty: 60 | Fill #0

## 2017-03-02 ENCOUNTER — Telehealth: Payer: Self-pay | Admitting: Gastroenterology

## 2017-03-02 MED ORDER — OMEPRAZOLE 40 MG PO CPDR: 40.0000 mg | DELAYED_RELEASE_CAPSULE | Freq: Two times a day (BID) | ORAL | 0 refills | Status: DC

## 2017-03-02 MED FILL — OMEPRAZOLE DR 40 MG CAPSULE: 40 | 90 days supply | Qty: 180 | Fill #0

## 2017-03-02 NOTE — Telephone Encounter (Signed)
Refill request for omeprazole 40 mg BID.  Last seen 07-2016.

## 2017-03-02 NOTE — Telephone Encounter (Signed)
Can this please be sent to [facility] today?

## 2017-03-02 NOTE — Telephone Encounter (Signed)
Patient states he needs a new prescriptions of medication [facility]eprazole [facility] due to change in insurance.

## 2017-03-29 MED FILL — ALPRAZolam 2 MG TABS: 2 | 30 days supply | Qty: 60 | Fill #0

## 2017-04-26 MED FILL — ALPRAZolam 2 MG TABS: 2 | 30 days supply | Qty: 60 | Fill #1

## 2017-05-25 MED FILL — ALPRAZolam 2 MG TABS: 2 | 30 days supply | Qty: 60 | Fill #2

## 2017-06-24 MED FILL — ALPRAZolam 2 MG TABS: 2 | 30 days supply | Qty: 60 | Fill #3

## 2017-06-28 ENCOUNTER — Other Ambulatory Visit: Payer: Self-pay | Admitting: Gastroenterology

## 2017-06-28 MED FILL — OMEPRAZOLE DR 40 MG CAPSULE: 40 | 90 days supply | Qty: 180 | Fill #0

## 2017-07-19 DIAGNOSIS — F3181 Bipolar II disorder: Secondary | ICD-10-CM | POA: Diagnosis not present

## 2017-07-19 DIAGNOSIS — K219 Gastro-esophageal reflux disease without esophagitis: Secondary | ICD-10-CM | POA: Diagnosis not present

## 2017-07-19 DIAGNOSIS — Z0000 Encounter for general adult medical examination without abnormal findings: Secondary | ICD-10-CM | POA: Diagnosis not present

## 2017-07-19 DIAGNOSIS — R42 Dizziness and giddiness: Secondary | ICD-10-CM | POA: Diagnosis not present

## 2017-07-19 DIAGNOSIS — R1013 Epigastric pain: Secondary | ICD-10-CM | POA: Diagnosis not present

## 2017-07-19 DIAGNOSIS — J4530 Mild persistent asthma, uncomplicated: Secondary | ICD-10-CM | POA: Diagnosis not present

## 2017-07-19 DIAGNOSIS — Z23 Encounter for immunization: Secondary | ICD-10-CM | POA: Diagnosis not present

## 2017-07-19 DIAGNOSIS — F1020 Alcohol dependence, uncomplicated: Secondary | ICD-10-CM | POA: Diagnosis not present

## 2017-07-19 DIAGNOSIS — F10220 Alcohol dependence with intoxication, uncomplicated: Secondary | ICD-10-CM | POA: Diagnosis not present

## 2017-07-26 MED FILL — ALPRAZolam 2 MG TABS: 2 | 30 days supply | Qty: 60 | Fill #4

## 2017-08-26 MED FILL — ALPRAZolam 2 MG TABS: 2 | 30 days supply | Qty: 60 | Fill #5

## 2017-09-17 ENCOUNTER — Other Ambulatory Visit: Payer: Self-pay | Admitting: Gastroenterology

## 2017-09-23 ENCOUNTER — Other Ambulatory Visit: Payer: Self-pay | Admitting: Gastroenterology

## 2017-09-23 MED FILL — OMEPRAZOLE 40 MG CPDR: 40 | 90 days supply | Qty: 180 | Fill #0

## 2017-09-23 NOTE — Telephone Encounter (Signed)
Left a detailed voicemail to make a follow up for all additional refills.

## 2017-09-23 NOTE — Telephone Encounter (Signed)
Done, but last one I will do without an appointment.  See me in next 3 months.

## 2017-09-23 NOTE — Telephone Encounter (Signed)
Omeprazole 40 mg BID. No follow up scheduled. Last seen 07-2016. Please advise.

## 2017-09-27 MED FILL — ALPRAZolam 2 MG TABS: 2 | 30 days supply | Qty: 75 | Fill #0

## 2017-10-25 MED FILL — ALPRAZolam 2 MG TABS: 2 | 30 days supply | Qty: 75 | Fill #1

## 2017-11-24 MED FILL — ALPRAZolam 2 MG TABS: 2 | 30 days supply | Qty: 75 | Fill #2

## 2017-12-23 MED FILL — ALPRAZolam 2 MG TABS: 2 | 30 days supply | Qty: 75 | Fill #3

## 2017-12-29 ENCOUNTER — Other Ambulatory Visit: Payer: Self-pay | Admitting: Gastroenterology

## 2017-12-31 ENCOUNTER — Telehealth: Payer: Self-pay | Admitting: Gastroenterology

## 2017-12-31 ENCOUNTER — Other Ambulatory Visit: Payer: Self-pay | Admitting: Gastroenterology

## 2017-12-31 MED ORDER — OMEPRAZOLE 40 MG PO CPDR: 40.0000 mg | DELAYED_RELEASE_CAPSULE | Freq: Two times a day (BID) | ORAL | 0 refills | Status: DC

## 2017-12-31 NOTE — Telephone Encounter (Signed)
Tried to call number x2. Phone keeps hanging up. Pt given a 30 days supply. Needs a follow up for any additional refills

## 2017-12-31 NOTE — Addendum Note (Signed)
Addended by: Alexis FrockDD, Omair Dettmer V on: 12/31/2017 04:38 PM   Modules accepted: Orders

## 2018-01-24 MED FILL — ALPRAZolam 2 MG TABS: 2 | 30 days supply | Qty: 75 | Fill #4

## 2018-02-23 DIAGNOSIS — F419 Anxiety disorder, unspecified: Secondary | ICD-10-CM | POA: Diagnosis not present

## 2018-02-23 DIAGNOSIS — F1410 Cocaine abuse, uncomplicated: Secondary | ICD-10-CM | POA: Diagnosis not present

## 2018-02-23 DIAGNOSIS — T887XXA Unspecified adverse effect of drug or medicament, initial encounter: Secondary | ICD-10-CM | POA: Diagnosis not present

## 2018-02-23 DIAGNOSIS — J45909 Unspecified asthma, uncomplicated: Secondary | ICD-10-CM | POA: Diagnosis not present

## 2018-02-23 DIAGNOSIS — F411 Generalized anxiety disorder: Secondary | ICD-10-CM | POA: Diagnosis not present

## 2018-02-23 DIAGNOSIS — E876 Hypokalemia: Secondary | ICD-10-CM | POA: Diagnosis not present

## 2018-02-23 DIAGNOSIS — T39311A Poisoning by propionic acid derivatives, accidental (unintentional), initial encounter: Secondary | ICD-10-CM | POA: Diagnosis not present

## 2018-02-23 DIAGNOSIS — T50904A Poisoning by unspecified drugs, medicaments and biological substances, undetermined, initial encounter: Secondary | ICD-10-CM | POA: Diagnosis not present

## 2018-02-23 DIAGNOSIS — Y906 Blood alcohol level of 120-199 mg/100 ml: Secondary | ICD-10-CM | POA: Diagnosis not present

## 2018-02-23 DIAGNOSIS — F3181 Bipolar II disorder: Secondary | ICD-10-CM | POA: Diagnosis not present

## 2018-02-23 DIAGNOSIS — T391X1A Poisoning by 4-Aminophenol derivatives, accidental (unintentional), initial encounter: Secondary | ICD-10-CM | POA: Diagnosis not present

## 2018-02-23 DIAGNOSIS — Z87891 Personal history of nicotine dependence: Secondary | ICD-10-CM | POA: Diagnosis not present

## 2018-02-23 DIAGNOSIS — F1010 Alcohol abuse, uncomplicated: Secondary | ICD-10-CM | POA: Diagnosis not present

## 2018-02-23 DIAGNOSIS — K219 Gastro-esophageal reflux disease without esophagitis: Secondary | ICD-10-CM | POA: Diagnosis not present

## 2018-02-23 DIAGNOSIS — F10129 Alcohol abuse with intoxication, unspecified: Secondary | ICD-10-CM | POA: Diagnosis not present

## 2018-02-23 MED FILL — ALPRAZolam 2 MG TABS: 2 | 30 days supply | Qty: 75 | Fill #5

## 2018-02-26 DIAGNOSIS — F1994 Other psychoactive substance use, unspecified with psychoactive substance-induced mood disorder: Secondary | ICD-10-CM | POA: Diagnosis not present

## 2018-02-26 DIAGNOSIS — F29 Unspecified psychosis not due to a substance or known physiological condition: Secondary | ICD-10-CM | POA: Diagnosis not present

## 2018-02-26 DIAGNOSIS — Z915 Personal history of self-harm: Secondary | ICD-10-CM | POA: Diagnosis not present

## 2018-02-26 DIAGNOSIS — F3181 Bipolar II disorder: Secondary | ICD-10-CM | POA: Diagnosis not present

## 2018-02-26 DIAGNOSIS — J45909 Unspecified asthma, uncomplicated: Secondary | ICD-10-CM | POA: Diagnosis not present

## 2018-02-26 DIAGNOSIS — Z79899 Other long term (current) drug therapy: Secondary | ICD-10-CM | POA: Diagnosis not present

## 2018-02-26 DIAGNOSIS — F1020 Alcohol dependence, uncomplicated: Secondary | ICD-10-CM | POA: Diagnosis not present

## 2018-02-26 DIAGNOSIS — F1029 Alcohol dependence with unspecified alcohol-induced disorder: Secondary | ICD-10-CM | POA: Diagnosis not present

## 2018-02-26 DIAGNOSIS — T391X1A Poisoning by 4-Aminophenol derivatives, accidental (unintentional), initial encounter: Secondary | ICD-10-CM | POA: Diagnosis not present

## 2018-02-26 DIAGNOSIS — K219 Gastro-esophageal reflux disease without esophagitis: Secondary | ICD-10-CM | POA: Diagnosis not present

## 2018-02-26 DIAGNOSIS — F1420 Cocaine dependence, uncomplicated: Secondary | ICD-10-CM | POA: Diagnosis not present

## 2018-02-26 DIAGNOSIS — Z87891 Personal history of nicotine dependence: Secondary | ICD-10-CM | POA: Diagnosis not present

## 2018-02-26 DIAGNOSIS — F411 Generalized anxiety disorder: Secondary | ICD-10-CM | POA: Diagnosis not present

## 2018-02-26 DIAGNOSIS — E876 Hypokalemia: Secondary | ICD-10-CM | POA: Diagnosis not present

## 2018-02-26 DIAGNOSIS — F1410 Cocaine abuse, uncomplicated: Secondary | ICD-10-CM | POA: Diagnosis not present

## 2018-02-26 DIAGNOSIS — F1010 Alcohol abuse, uncomplicated: Secondary | ICD-10-CM | POA: Diagnosis not present

## 2018-03-01 MED ORDER — SALINE NASAL SPRAY 0.65 % NA SOLN
2.00 | NASAL | Status: DC
Start: ? — End: 2018-03-01

## 2018-03-01 MED ORDER — ALPRAZOLAM 0.5 MG PO TABS
0.50 | ORAL_TABLET | ORAL | Status: DC
Start: 2018-02-28 — End: 2018-03-01

## 2018-03-01 MED ORDER — GENERIC EXTERNAL MEDICATION
100.00 | Status: DC
Start: ? — End: 2018-03-01

## 2018-03-01 MED ORDER — PANTOPRAZOLE SODIUM 40 MG PO TBEC
40.00 | DELAYED_RELEASE_TABLET | ORAL | Status: DC
Start: 2018-02-28 — End: 2018-03-01

## 2018-03-01 MED ORDER — THIAMINE HCL 100 MG PO TABS
100.00 | ORAL_TABLET | ORAL | Status: DC
Start: 2018-03-01 — End: 2018-03-01

## 2018-03-01 MED ORDER — HYDROXYZINE PAMOATE 50 MG PO CAPS
50.00 | ORAL_CAPSULE | ORAL | Status: DC
Start: ? — End: 2018-03-01

## 2018-03-01 MED ORDER — GENERIC EXTERNAL MEDICATION
4.00 | Status: DC
Start: ? — End: 2018-03-01

## 2018-03-01 MED ORDER — ACETAMINOPHEN 325 MG PO TABS
650.00 | ORAL_TABLET | ORAL | Status: DC
Start: ? — End: 2018-03-01

## 2018-03-01 MED ORDER — CALCIUM CARBONATE ANTACID 500 MG PO CHEW
500.00 | CHEWABLE_TABLET | ORAL | Status: DC
Start: ? — End: 2018-03-01

## 2018-03-01 MED ORDER — FOLIC ACID 1 MG PO TABS
1.00 | ORAL_TABLET | ORAL | Status: DC
Start: 2018-03-01 — End: 2018-03-01

## 2018-03-01 MED ORDER — ALBUTEROL SULFATE (2.5 MG/3ML) 0.083% IN NEBU
2.50 | INHALATION_SOLUTION | RESPIRATORY_TRACT | Status: DC
Start: ? — End: 2018-03-01

## 2018-03-01 MED ORDER — NICOTINE 21 MG/24HR TD PT24
1.00 | MEDICATED_PATCH | TRANSDERMAL | Status: DC
Start: 2018-02-28 — End: 2018-03-01

## 2018-03-01 MED ORDER — TETRAHYDROZOLINE HCL 0.05 % OP SOLN
2.00 | OPHTHALMIC | Status: DC
Start: ? — End: 2018-03-01

## 2018-03-01 MED ORDER — THERA PO TABS
1.00 | ORAL_TABLET | ORAL | Status: DC
Start: 2018-03-01 — End: 2018-03-01

## 2018-03-01 MED ORDER — NALTREXONE HCL 50 MG PO TABS
50.00 | ORAL_TABLET | ORAL | Status: DC
Start: 2018-03-01 — End: 2018-03-01

## 2018-03-01 MED ORDER — BUDESONIDE-FORMOTEROL FUMARATE 80-4.5 MCG/ACT IN AERO
2.00 | INHALATION_SPRAY | RESPIRATORY_TRACT | Status: DC
Start: 2018-02-28 — End: 2018-03-01

## 2018-03-01 MED ORDER — BISACODYL 5 MG PO TBEC
5.00 | DELAYED_RELEASE_TABLET | ORAL | Status: DC
Start: ? — End: 2018-03-01

## 2018-03-01 MED ORDER — BENZOCAINE-MENTHOL 15-3.6 MG MT LOZG
1.00 | LOZENGE | OROMUCOSAL | Status: DC
Start: ? — End: 2018-03-01

## 2018-03-01 MED ORDER — LOPERAMIDE HCL 2 MG PO CAPS
2.00 | ORAL_CAPSULE | ORAL | Status: DC
Start: ? — End: 2018-03-01

## 2018-03-01 MED ORDER — DEXTROMETHORPHAN-GUAIFENESIN 10-100 MG/5ML PO SYRP
10.00 | ORAL_SOLUTION | ORAL | Status: DC
Start: ? — End: 2018-03-01

## 2018-03-01 MED ORDER — LORATADINE 10 MG PO TABS
10.00 | ORAL_TABLET | ORAL | Status: DC
Start: ? — End: 2018-03-01

## 2018-03-01 MED ORDER — ALUM & MAG HYDROXIDE-SIMETH 200-200-20 MG/5ML PO SUSP
30.00 | ORAL | Status: DC
Start: ? — End: 2018-03-01

## 2018-03-01 MED ORDER — GENERIC EXTERNAL MEDICATION
10.00 | Status: DC
Start: ? — End: 2018-03-01

## 2018-03-01 MED ORDER — IBUPROFEN 600 MG PO TABS
600.00 | ORAL_TABLET | ORAL | Status: DC
Start: ? — End: 2018-03-01

## 2018-03-07 ENCOUNTER — Telehealth: Payer: Self-pay | Admitting: Gastroenterology

## 2018-03-08 MED ORDER — OMEPRAZOLE 40 MG PO CPDR: 40.0000 mg | DELAYED_RELEASE_CAPSULE | Freq: Two times a day (BID) | ORAL | 0 refills | Status: DC

## 2018-03-08 MED FILL — OMEPRAZOLE 40 MG CPDR: 40 | 30 days supply | Qty: 60 | Fill #0

## 2018-03-08 NOTE — Telephone Encounter (Signed)
30 days supply given. Needs to keep follow up for any additional refills.

## 2018-04-05 ENCOUNTER — Telehealth: Payer: Self-pay

## 2018-04-05 NOTE — Telephone Encounter (Signed)
Covid-19 travel screening questions  Have you traveled in the last 14 days? If yes where?  Do you now or have you had a fever in the last 14 days?  Do you have any respiratory symptoms of shortness of breath or cough now or in the last 14 days?  Do you have a medical history of Congestive Heart Failure? N/A  Do you have a medical history of lung disease? N/A  Do you have any family members or close contacts with diagnosed or suspected Covid-19?  Left message for pt to call and reschedule appt if they answer yes to any of the above questions.

## 2018-04-06 ENCOUNTER — Ambulatory Visit: Payer: 59 | Admitting: Gastroenterology

## 2018-04-06 ENCOUNTER — Encounter: Payer: Self-pay | Admitting: Gastroenterology

## 2018-04-06 ENCOUNTER — Other Ambulatory Visit: Payer: Self-pay

## 2018-04-06 VITALS — BP 140/92 | HR 88 | Temp 97.9°F | Ht 66.5 in | Wt 227.6 lb

## 2018-04-06 DIAGNOSIS — K219 Gastro-esophageal reflux disease without esophagitis: Secondary | ICD-10-CM

## 2018-04-06 MED ORDER — OMEPRAZOLE 40 MG PO CPDR: 40.0000 mg | DELAYED_RELEASE_CAPSULE | Freq: Two times a day (BID) | ORAL | 3 refills | Status: DC

## 2018-04-06 MED FILL — OMEPRAZOLE 40 MG CPDR: 40 | 90 days supply | Qty: 180 | Fill #0

## 2018-04-06 NOTE — Progress Notes (Signed)
[facility] Progress Note  Chief Complaint: GERD  Subjective  History:  Last seen July 2018 for GERD requiring twice daily omeprazole.  He had breakthrough symptoms, but also continued to use electronic cigarettes and have dietary indiscretions.  Medicine was continued and Carafate added.  He has called multiple times for medicine refills, no visit since then.  Discharge summaries from a recent medical and subsequent mental health admissions at [facility] health indicate recent use of cocaine, cannabis, excess alcohol, and an episode where he took 22 tablets of Advil PM along with 1/5 of liquor.  His labs apparently showed elevated LFTs and Tylenol level.  He was treated with Mucomyst.  After recovery, he had a mental health admission.  Significant polysubstance use and psychosocial stressors are outlined in the history and physical accessed through care everywhere.   Zayde reports that he has lately improved, having been 42 days sober and improved outlook, working full-time as a Probation officer.  He is also been focusing his energy is more on his family.  He has heartburn under good control if he takes omeprazole 40 mg twice daily.  He denies dysphagia, odynophagia, nausea, vomiting, early satiety or weight loss.  ROS: Cardiovascular:  no chest pain Respiratory: no dyspnea Chronic allergies  The patient's Past Medical, Family and Social History were reviewed and are on file in the EMR.  Objective:  Med list reviewed  Current Outpatient Medications:    alprazolam (XANAX) 2 MG tablet, Take 2 mg by mouth 2 (two) times daily., Disp: , Rfl:    calcium carbonate (TUMS - DOSED IN MG ELEMENTAL CALCIUM) 500 MG chewable tablet, Chew 1 tablet by mouth daily. HAS BEEN TAKING 15 A DAY SINCE OUT OF OMEPRAZOLE, Disp: , Rfl:    fluticasone furoate-vilanterol (BREO ELLIPTA) 100-25 MCG/INH AEPB, Inhale 1 puff into the lungs daily., Disp: , Rfl:    ibuprofen (ADVIL,MOTRIN) 200 MG tablet,  Take 400 mg by mouth as needed for headache (headache). , Disp: , Rfl:    levocetirizine (XYZAL) 5 MG tablet, TAKE 1 TABLET BY MOUTH EVERY EVENING (Patient taking differently: TAKE 1 TABLET BY MOUTH EVERY MORNING), Disp: 90 tablet, Rfl: 1   Multiple Vitamins-Minerals (MULTIVITAMIN & MINERAL PO), Take 1 tablet by mouth as needed. , Disp: , Rfl:    omeprazole (PRILOSEC) 40 MG capsule, Take 1 capsule (40 mg total) by mouth 2 (two) times daily., Disp: 180 capsule, Rfl: 3   PROAIR HFA 108 (90 BASE) MCG/ACT inhaler, INHALE 2 PUFFS INTO THE LUNGS EVERY 4 HOURS AS NEEDED FOR WHEEZING., Disp: 25.5 each, Rfl: 3   Vital signs in last 24 hrs: Vitals:   04/06/18 1515  BP: (!) 140/92  Pulse: 88  Temp: 97.9 F (36.6 C)  BMI 36  Physical Exam  Well-appearing, with normal vocal quality  HEENT: sclera anicteric, oral mucosa moist without lesions  Neck: supple, no thyromegaly, JVD or lymphadenopathy  Cardiac: RRR without murmurs, S1S2 heard, no peripheral edema  Pulm: clear to auscultation bilaterally, normal RR and effort noted  Abdomen: soft, no tenderness, with active bowel sounds. No guarding or palpable hepatosplenomegaly.  Skin; warm and dry, no jaundice or rash    @ASSESSMENTPLANBEGIN @ Assessment: Encounter Diagnosis  Name Primary?   Gastroesophageal reflux disease without esophagitis Yes   Symptoms under good control.  Possible stressors another diet and lifestyle triggers. He reports that on Wednesday therapy he has significant breakthrough symptoms. No red flag symptoms to suggest need for endoscopy now.  Plan:  Continue omeprazole 40 mg twice daily, as that has consistently been ineffective dose for this patient. Ongoing diet and lifestyle measures to control reflux symptoms. See me in a year or sooner as needed.  Total time 15 minutes, all spent face-to-face with patient in counseling and coordination of care.   Charlie Pitter III

## 2018-04-06 NOTE — Patient Instructions (Signed)
If you are age 32 or older, your body mass index should be between 23-30. Your Body mass index is 36.19 kg/m. If this is out of the aforementioned range listed, please consider follow up with your Primary Care Provider.  If you are age 26 or younger, your body mass index should be between 19-25. Your Body mass index is 36.19 kg/m. If this is out of the aformentioned range listed, please consider follow up with your Primary Care Provider.   To help prevent the possible spread of infection to our patients, communities, and staff; we will be implementing the following measures:  Please only allow one visitor/family member to accompany you to any upcoming appointments with [facility]. If you have any concerns about this please contact our office to discuss prior to the appointment.   It was a pleasure to see you today!  Dr. Myrtie Neither

## 2018-04-25 DIAGNOSIS — F410 Panic disorder [episodic paroxysmal anxiety] without agoraphobia: Secondary | ICD-10-CM | POA: Diagnosis not present

## 2018-06-27 MED FILL — OMEPRAZOLE DR 40 MG CAPSULE: 40 | 90 days supply | Qty: 180 | Fill #1

## 2018-09-22 MED FILL — OMEPRAZOLE DR 40 MG CAPSULE: 40 | 90 days supply | Qty: 180 | Fill #2

## 2018-10-04 MED FILL — OMEPRAZOLE DR 40 MG CAPSULE: 40 | 90 days supply | Qty: 180 | Fill #2

## 2019-01-04 MED FILL — OMEPRAZOLE 40 MG CPDR: 40 | 30 days supply | Qty: 60 | Fill #3

## 2019-01-18 DIAGNOSIS — R634 Abnormal weight loss: Secondary | ICD-10-CM | POA: Diagnosis not present

## 2019-01-18 DIAGNOSIS — R030 Elevated blood-pressure reading, without diagnosis of hypertension: Secondary | ICD-10-CM | POA: Diagnosis not present

## 2019-01-18 DIAGNOSIS — R002 Palpitations: Secondary | ICD-10-CM | POA: Diagnosis not present

## 2019-01-18 MED FILL — METOPROLOL SUCCINATE ER 25: 25 | 30 days supply | Qty: 30 | Fill #0

## 2019-02-11 MED FILL — OMEPRAZOLE 40 MG CPDR: 40 | 30 days supply | Qty: 60 | Fill #0

## 2019-03-01 MED FILL — METOPROLOL SUCCINATE ER 25: 25 | 30 days supply | Qty: 30 | Fill #1

## 2019-03-13 MED FILL — GABAPENTIN 300 MG CAPSULE: 300 | 30 days supply | Qty: 90 | Fill #0

## 2019-03-13 MED FILL — OMEPRAZOLE DR 40 MG CAPSULE: 40 | 30 days supply | Qty: 60 | Fill #0

## 2019-04-03 MED FILL — METOPROLOL SUCCINATE ER 25: 25 | 30 days supply | Qty: 30 | Fill #2

## 2019-04-12 ENCOUNTER — Other Ambulatory Visit: Payer: Self-pay | Admitting: Gastroenterology

## 2019-04-12 MED FILL — OMEPRAZOLE 40 MG CPDR: 40 | 30 days supply | Qty: 60 | Fill #0

## 2019-04-21 MED FILL — GABAPENTIN 300 MG CAPSULE: 300 | 30 days supply | Qty: 90 | Fill #1

## 2019-05-06 MED FILL — METOPROLOL SUCCINATE ER 25: 25 | 30 days supply | Qty: 30 | Fill #3

## 2019-05-31 ENCOUNTER — Other Ambulatory Visit: Payer: Self-pay | Admitting: Gastroenterology

## 2019-06-07 ENCOUNTER — Other Ambulatory Visit: Payer: Self-pay | Admitting: Gastroenterology

## 2019-06-09 MED FILL — METOPROLOL SUCCINATE ER 25: 25 | 30 days supply | Qty: 30 | Fill #4

## 2019-06-22 MED FILL — METOPROLOL SUCCINATE ER 25: 25 | 30 days supply | Qty: 30 | Fill #4

## 2019-07-28 MED FILL — METOPROLOL SUCCINATE ER 25: 25 | 30 days supply | Qty: 30 | Fill #5

## 2019-08-28 ENCOUNTER — Other Ambulatory Visit (HOSPITAL_COMMUNITY): Payer: Self-pay | Admitting: Physician Assistant

## 2019-08-28 MED FILL — METOPROLOL SUCCINATE ER 25: 25 | 30 days supply | Qty: 30 | Fill #0

## 2019-10-02 MED FILL — METOPROLOL SUCCINATE ER 25: 25 | 30 days supply | Qty: 30 | Fill #1

## 2019-10-25 ENCOUNTER — Other Ambulatory Visit (HOSPITAL_COMMUNITY): Payer: Self-pay | Admitting: Physician Assistant

## 2019-10-25 DIAGNOSIS — Z0000 Encounter for general adult medical examination without abnormal findings: Secondary | ICD-10-CM | POA: Diagnosis not present

## 2019-10-25 DIAGNOSIS — K219 Gastro-esophageal reflux disease without esophagitis: Secondary | ICD-10-CM | POA: Diagnosis not present

## 2019-10-25 DIAGNOSIS — Z6834 Body mass index (BMI) 34.0-34.9, adult: Secondary | ICD-10-CM | POA: Diagnosis not present

## 2019-10-25 DIAGNOSIS — J4530 Mild persistent asthma, uncomplicated: Secondary | ICD-10-CM | POA: Diagnosis not present

## 2019-10-25 DIAGNOSIS — F411 Generalized anxiety disorder: Secondary | ICD-10-CM | POA: Diagnosis not present

## 2019-10-25 DIAGNOSIS — E6609 Other obesity due to excess calories: Secondary | ICD-10-CM | POA: Diagnosis not present

## 2019-10-25 DIAGNOSIS — R6882 Decreased libido: Secondary | ICD-10-CM | POA: Diagnosis not present

## 2019-10-25 MED FILL — OMEPRAZOLE 40 MG CPDR: 40 | 30 days supply | Qty: 60 | Fill #0

## 2019-11-03 MED FILL — METOPROLOL SUCCINATE ER 25: 25 | 30 days supply | Qty: 30 | Fill #2

## 2019-11-27 MED FILL — OMEPRAZOLE DR 40 MG CAPSULE: 40 | 30 days supply | Qty: 60 | Fill #1

## 2019-12-02 DIAGNOSIS — Z0100 Encounter for examination of eyes and vision without abnormal findings: Secondary | ICD-10-CM | POA: Diagnosis not present

## 2019-12-04 MED FILL — METOPROLOL SUCCINATE ER 25: 25 | 30 days supply | Qty: 30 | Fill #3

## 2019-12-25 MED FILL — OMEPRAZOLE 40 MG CPDR: 40 | 30 days supply | Qty: 60 | Fill #2

## 2020-01-08 MED FILL — METOPROLOL SUCCINATE ER 25: 25 | 30 days supply | Qty: 30 | Fill #4

## 2020-01-29 MED FILL — OMEPRAZOLE 40 MG CPDR: 40 | 90 days supply | Qty: 180 | Fill #3

## 2020-02-12 MED FILL — METOPROLOL SUCCINATE ER 25: 25 | 30 days supply | Qty: 30 | Fill #5

## 2020-02-24 ENCOUNTER — Other Ambulatory Visit: Payer: 59

## 2020-02-24 DIAGNOSIS — Z20822 Contact with and (suspected) exposure to covid-19: Secondary | ICD-10-CM

## 2020-02-25 LAB — SARS-COV-2, NAA 2 DAY TAT

## 2020-02-25 LAB — NOVEL CORONAVIRUS, NAA: SARS-CoV-2, NAA: DETECTED — AB

## 2020-02-26 ENCOUNTER — Telehealth: Payer: Self-pay | Admitting: Adult Health

## 2020-02-26 NOTE — Telephone Encounter (Signed)
Called to discuss with patient about COVID-19 symptoms and the use of one of the available treatments for those with mild to moderate Covid symptoms and at a high risk of hospitalization.  Pt appears to qualify for outpatient treatment due to co-morbid conditions and/or a member of an at-risk group in accordance with the FDA Emergency Use Authorization.      Unable to reach pt - LMOM   [name] C [name]   

## 2020-03-11 ENCOUNTER — Other Ambulatory Visit (HOSPITAL_COMMUNITY): Payer: Self-pay | Admitting: Family Medicine

## 2020-03-11 MED FILL — METOPROLOL SUCCINATE ER 25: 25 | 30 days supply | Qty: 30 | Fill #0

## 2020-04-15 MED FILL — METOPROLOL SUCCINATE ER 25: 25 | 30 days supply | Qty: 30 | Fill #1

## 2020-04-22 ENCOUNTER — Other Ambulatory Visit (HOSPITAL_COMMUNITY): Payer: Self-pay

## 2020-04-22 MED FILL — Omeprazole Cap Delayed Release 40 MG: ORAL | 90 days supply | Qty: 180 | Fill #0 | Status: AC

## 2020-07-15 ENCOUNTER — Other Ambulatory Visit (HOSPITAL_COMMUNITY): Payer: Self-pay

## 2020-07-15 MED FILL — Omeprazole Cap Delayed Release 40 MG: ORAL | 90 days supply | Qty: 180 | Fill #1 | Status: AC

## 2020-09-29 DIAGNOSIS — Z20822 Contact with and (suspected) exposure to covid-19: Secondary | ICD-10-CM | POA: Diagnosis not present

## 2020-10-07 ENCOUNTER — Other Ambulatory Visit (HOSPITAL_COMMUNITY): Payer: Self-pay

## 2020-10-07 MED ORDER — OMEPRAZOLE 40 MG PO CPDR
40.0000 mg | DELAYED_RELEASE_CAPSULE | Freq: Two times a day (BID) | ORAL | 1 refills | Status: DC
  Filled 2020-10-07: qty 60, 30d supply, fill #0
  Filled 2021-06-30: qty 60, 30d supply, fill #1

## 2020-10-16 ENCOUNTER — Other Ambulatory Visit (HOSPITAL_COMMUNITY): Payer: Self-pay

## 2020-11-01 ENCOUNTER — Other Ambulatory Visit (HOSPITAL_COMMUNITY): Payer: Self-pay

## 2020-11-01 DIAGNOSIS — Z0000 Encounter for general adult medical examination without abnormal findings: Secondary | ICD-10-CM | POA: Diagnosis not present

## 2020-11-01 DIAGNOSIS — K219 Gastro-esophageal reflux disease without esophagitis: Secondary | ICD-10-CM | POA: Diagnosis not present

## 2020-11-01 DIAGNOSIS — J4530 Mild persistent asthma, uncomplicated: Secondary | ICD-10-CM | POA: Diagnosis not present

## 2020-11-01 DIAGNOSIS — F1020 Alcohol dependence, uncomplicated: Secondary | ICD-10-CM | POA: Diagnosis not present

## 2020-11-01 DIAGNOSIS — F3181 Bipolar II disorder: Secondary | ICD-10-CM | POA: Diagnosis not present

## 2020-11-01 DIAGNOSIS — F1994 Other psychoactive substance use, unspecified with psychoactive substance-induced mood disorder: Secondary | ICD-10-CM | POA: Diagnosis not present

## 2020-11-01 DIAGNOSIS — R5383 Other fatigue: Secondary | ICD-10-CM | POA: Diagnosis not present

## 2020-11-01 MED ORDER — OMEPRAZOLE 40 MG PO CPDR
40.0000 mg | DELAYED_RELEASE_CAPSULE | Freq: Two times a day (BID) | ORAL | 1 refills | Status: DC
  Filled 2020-11-01: qty 60, 30d supply, fill #0
  Filled 2020-12-09: qty 60, 30d supply, fill #1

## 2020-11-04 ENCOUNTER — Other Ambulatory Visit (HOSPITAL_COMMUNITY): Payer: Self-pay

## 2020-11-11 ENCOUNTER — Other Ambulatory Visit (HOSPITAL_COMMUNITY): Payer: Self-pay

## 2020-11-12 ENCOUNTER — Other Ambulatory Visit (HOSPITAL_COMMUNITY): Payer: Self-pay

## 2020-11-12 MED ORDER — OMEPRAZOLE 40 MG PO CPDR
40.0000 mg | DELAYED_RELEASE_CAPSULE | Freq: Two times a day (BID) | ORAL | 1 refills | Status: DC
  Filled 2021-01-06: qty 180, 90d supply, fill #0
  Filled 2021-03-31: qty 180, 90d supply, fill #1

## 2020-12-09 ENCOUNTER — Other Ambulatory Visit (HOSPITAL_COMMUNITY): Payer: Self-pay

## 2021-01-06 ENCOUNTER — Other Ambulatory Visit (HOSPITAL_COMMUNITY): Payer: Self-pay

## 2021-03-31 ENCOUNTER — Other Ambulatory Visit (HOSPITAL_COMMUNITY): Payer: Self-pay

## 2021-06-13 ENCOUNTER — Other Ambulatory Visit (HOSPITAL_COMMUNITY): Payer: Self-pay

## 2021-06-13 MED ORDER — IBUPROFEN 800 MG PO TABS
800.0000 mg | ORAL_TABLET | Freq: Three times a day (TID) | ORAL | 0 refills | Status: DC
  Filled 2021-06-13: qty 21, 7d supply, fill #0

## 2021-06-13 MED ORDER — AMOXICILLIN 500 MG PO CAPS
500.0000 mg | ORAL_CAPSULE | Freq: Three times a day (TID) | ORAL | 0 refills | Status: DC
  Filled 2021-06-13: qty 21, 7d supply, fill #0

## 2021-06-30 ENCOUNTER — Other Ambulatory Visit (HOSPITAL_COMMUNITY): Payer: Self-pay

## 2021-06-30 MED ORDER — OMEPRAZOLE 40 MG PO CPDR
40.0000 mg | DELAYED_RELEASE_CAPSULE | Freq: Two times a day (BID) | ORAL | 1 refills | Status: DC
  Filled 2021-06-30: qty 180, 90d supply, fill #0

## 2021-09-16 ENCOUNTER — Ambulatory Visit (INDEPENDENT_AMBULATORY_CARE_PROVIDER_SITE_OTHER): Payer: 59 | Admitting: Allergy & Immunology

## 2021-09-16 ENCOUNTER — Encounter: Payer: Self-pay | Admitting: Allergy & Immunology

## 2021-09-16 VITALS — BP 120/76 | HR 85 | Temp 98.0°F | Ht 67.0 in | Wt 195.2 lb

## 2021-09-16 DIAGNOSIS — J3089 Other allergic rhinitis: Secondary | ICD-10-CM | POA: Diagnosis not present

## 2021-09-16 DIAGNOSIS — J45998 Other asthma: Secondary | ICD-10-CM | POA: Diagnosis not present

## 2021-09-16 DIAGNOSIS — K219 Gastro-esophageal reflux disease without esophagitis: Secondary | ICD-10-CM

## 2021-09-16 DIAGNOSIS — J4520 Mild intermittent asthma, uncomplicated: Secondary | ICD-10-CM

## 2021-09-16 DIAGNOSIS — J302 Other seasonal allergic rhinitis: Secondary | ICD-10-CM

## 2021-09-16 DIAGNOSIS — R140 Abdominal distension (gaseous): Secondary | ICD-10-CM | POA: Diagnosis not present

## 2021-09-16 DIAGNOSIS — J4530 Mild persistent asthma, uncomplicated: Secondary | ICD-10-CM | POA: Diagnosis not present

## 2021-09-16 NOTE — Patient Instructions (Addendum)
1. Mild intermittent asthma, uncomplicated - Lung testing looked good today. - However, since you are using your albuterol so frequently, we are going to start Symbicort 2 puffs at night. - Spacer sample and demonstration provided. - Daily controller medication(s): Symbicort 160/4.29mcg two puffs once daily - Prior to physical activity: albuterol 2 puffs 10-15 minutes before physical activity. - Rescue medications: albuterol 4 puffs every 4-6 hours as needed - Asthma control goals:  * Full participation in all desired activities (may need albuterol before activity) * Albuterol use two time or less a week on average (not counting use with activity) * Cough interfering with sleep two time or less a month * Oral steroids no more than once a year * No hospitalizations  2. Seasonal and perennial allergic rhinitis - Testing today showed: grasses, ragweed, trees, indoor molds, outdoor molds, dust mites, cat, dog, cockroach, and horse . - Copy of test results provided.  - Avoidance measures provided. - Start taking: carbinoxamine 4mg  every 8 hours as needed (can be OVERDRYING) - You can use an extra dose of the antihistamine, if needed, for breakthrough symptoms.  - Consider nasal saline rinses 1-2 times daily to remove allergens from the nasal cavities as well as help with mucous clearance (this is especially helpful to do before the nasal sprays are given) - Consider allergy shots as a means of long-term control. - Allergy shots "re-train" and "reset" the immune system to ignore environmental allergens and decrease the resulting immune response to those allergens (sneezing, itchy watery eyes, runny nose, nasal congestion, etc).    - Allergy shots improve symptoms in 75-85% of patients.  - We can discuss more at the next appointment if the medications are not working for you.  3. Gastroesophageal reflux disease - Continue with omeprazole twice daily. - We may try decreasing to once daily  at some point. - We could substitute one of the doses with Pepcid (famotidine) which does not have the side effects of the omeprazole.   4. Bloating - Pork testing was negative.   5. Return in about 3 months (around 12/17/2021).    Please inform 12/19/2021 of any Emergency Department visits, hospitalizations, or changes in symptoms. Call us before going to the ED for breathing or allergy symptoms since we might be able to fit you in for a sick visit. Feel free to contact us anytime with any questions, problems, or concerns.  It was a pleasure to see you again today!  Websites that have reliable patient information: 1. American Academy of Asthma, Allergy, and Immunology: www.aaaai.org 2. Food Allergy Research and Education (FARE): foodallergy.org 3. Mothers of Asthmatics: http://www.asthmacommunitynetwork.org 4. American College of Allergy, Asthma, and Immunology: www.acaai.org   COVID-19 Vaccine Information can be found at: [place] For questions related to vaccine distribution or appointments, please email [email or web address] or call [phone].   We realize that you might be concerned about having an allergic reaction to the COVID19 vaccines. To help with that concern, WE ARE OFFERING THE COVID19 VACCINES IN OUR OFFICE! Ask the front desk for dates!     "Like" [phone] on Facebook and Instagram for our latest updates!      A healthy democracy works best when [place] participate! Make sure you are registered to vote! If you have moved or changed any of your contact information, you will need to get this updated before voting!  In some cases, you MAY be able to register to vote online: [employer]     Airborne Adult  Perc - 09/16/21 1458     Time Antigen Placed 1458    Allergen Manufacturer Waynette Buttery    Location Back    Number of Test 59    1. Control-Buffer 50% Glycerol Negative     2. Control-Histamine 1 mg/ml 3+    3. Albumin saline Negative    4. Bahia Negative    5. [place] Negative    6. Johnson --   +/-   7. [place] Blue Negative    8. Meadow Fescue Negative    9. Perennial Rye 3+    10. Sweet Vernal 3+    11. Timothy 3+    12. Cocklebur Negative    13. Burweed Marshelder Negative    14. Ragweed, short Negative    15. Ragweed, Giant Negative    16. Plantain,  English Negative    17. Lamb's Quarters Negative    18. Sheep Sorrell Negative    19. Rough Pigweed Negative    20. Marsh Elder, Rough Negative    21. Mugwort, Common Negative    22. Ash mix Negative    23. Birch mix Negative    24. Beech American Negative    25. Box, Elder Negative    26. Cedar, red 2+    27. Cottonwood, Eastern 2+    28. Elm mix Negative    29. Hickory Negative    30. Maple mix Negative    31. Oak, [place] mix Negative    32. Pecan Pollen Negative    33. Pine mix Negative    34. Sycamore Eastern Negative    35. Walnut, Black Pollen Negative    36. Alternaria alternata 3+    37. Cladosporium Herbarum Negative    38. Aspergillus mix Negative    39. Penicillium mix Negative    40. Bipolaris sorokiniana (Helminthosporium) Negative    41. Drechslera spicifera (Curvularia) Negative    42. Mucor plumbeus Negative    43. Fusarium moniliforme Negative    44. Aureobasidium pullulans (pullulara) Negative    45. Rhizopus oryzae Negative    46. Botrytis cinera Negative    47. Epicoccum nigrum Negative    48. Phoma betae Negative    49. Candida Albicans Negative    50. Trichophyton mentagrophytes Negative    51. Mite, D Farinae  5,000 AU/ml 3+    52. Mite, D Pteronyssinus  5,000 AU/ml 3+    53. Cat Hair 10,000 BAU/ml 2+    54.  Dog Epithelia 2+    55. Mixed Feathers Negative    56. Horse Epithelia 2+    57. Cockroach, German Negative    58. Mouse Negative    59. Tobacco Leaf Negative             Intradermal - 09/16/21 1555     Time Antigen Placed 1540     Allergen Manufacturer Waynette Buttery    Location Arm    Number of Test 8    Control Negative    [place] 1+    Ragweed mix 2+    Weed mix Negative    Mold 1 Omitted    Mold 2 3+    Mold 3 2+    Mold 4 3+    Cockroach Negative             Food Adult Perc - 09/16/21 1400     Time Antigen Placed 1458    Allergen Manufacturer Waynette Buttery    Location Back    Number of allergen test 1  37. Pork Negative             Reducing Pollen Exposure  The American Academy of Allergy, Asthma and Immunology suggests the following steps to reduce your exposure to pollen during allergy seasons.    Do not hang sheets or clothing out to dry; pollen may collect on these items. Do not mow lawns or spend time around freshly cut grass; mowing stirs up pollen. Keep windows closed at night.  Keep car windows closed while driving. Minimize morning activities outdoors, a time when pollen counts are usually at their highest. Stay indoors as much as possible when pollen counts or humidity is high and on windy days when pollen tends to remain in the air longer. Use air conditioning when possible.  Many air conditioners have filters that trap the pollen spores. Use a HEPA room air filter to remove pollen form the indoor air you breathe.  Control of Mold Allergen   Mold and fungi can grow on a variety of surfaces provided certain temperature and moisture conditions exist.  Outdoor molds grow on plants, decaying vegetation and soil.  The major outdoor mold, Alternaria and Cladosporium, are found in very high numbers during hot and dry conditions.  Generally, a late Summer - Fall peak is seen for common outdoor fungal spores.  Rain will temporarily lower outdoor mold spore count, but counts rise rapidly when the rainy period ends.  The most important indoor molds are Aspergillus and Penicillium.  Dark, humid and poorly ventilated basements are ideal sites for mold growth.  The next most common sites of mold growth are the  bathroom and the kitchen.  Outdoor (Seasonal) Mold Control  Positive outdoor molds via skin testing: Alternaria, Bipolaris (Helminthsporium), Drechslera (Curvalaria), and Mucor  Use air conditioning and keep windows closed Avoid exposure to decaying vegetation. Avoid leaf raking. Avoid grain handling. Consider wearing a face mask if working in moldy areas.    Indoor (Perennial) Mold Control   Positive indoor molds via skin testing: Aspergillus, Penicillium, Fusarium, Aureobasidium (Pullulara), and Rhizopus  Maintain humidity below 50%. Clean washable surfaces with 5% bleach solution. Remove sources e.g. contaminated carpets.     Control of Dog or Cat Allergen  Avoidance is the best way to manage a dog or cat allergy. If you have a dog or cat and are allergic to dog or cats, consider removing the dog or cat from the home. If you have a dog or cat but don't want to find it a new home, or if your family wants a pet even though someone in the household is allergic, here are some strategies that may help keep symptoms at bay:  Keep the pet out of your bedroom and restrict it to only a few rooms. Be advised that keeping the dog or cat in only one room will not limit the allergens to that room. Don't pet, hug or kiss the dog or cat; if you do, wash your hands with soap and water. High-efficiency particulate air (HEPA) cleaners run continuously in a bedroom or living room can reduce allergen levels over time. Regular use of a high-efficiency vacuum cleaner or a central vacuum can reduce allergen levels. Giving your dog or cat a bath at least once a week can reduce airborne allergen.    Control of Dust Mite Allergen    Dust mites play a major role in allergic asthma and rhinitis.  They occur in environments with high humidity wherever human skin is found.  Dust mites absorb  humidity from the atmosphere (ie, they do not drink) and feed on organic matter (including shed human and animal  skin).  Dust mites are a microscopic type of insect that you cannot see with the naked eye.  High levels of dust mites have been detected from mattresses, pillows, carpets, upholstered furniture, bed covers, clothes, soft toys and any woven material.  The principal allergen of the dust mite is found in its feces.  A gram of dust may contain 1,000 mites and 250,000 fecal particles.  Mite antigen is easily measured in the air during house cleaning activities.  Dust mites do not bite and do not cause harm to humans, other than by triggering allergies/asthma.    Ways to decrease your exposure to dust mites in your home:  Encase mattresses, box springs and pillows with a mite-impermeable barrier or cover   Wash sheets, blankets and drapes weekly in hot water (130 F) with detergent and dry them in a dryer on the hot setting.  Have the room cleaned frequently with a vacuum cleaner and a damp dust-mop.  For carpeting or rugs, vacuuming with a vacuum cleaner equipped with a high-efficiency particulate air (HEPA) filter.  The dust mite allergic individual should not be in a room which is being cleaned and should wait 1 hour after cleaning before going into the room. Do not sleep on upholstered furniture (eg, couches).   If possible removing carpeting, upholstered furniture and drapery from the home is ideal.  Horizontal blinds should be eliminated in the rooms where the person spends the most time (bedroom, study, television room).  Washable vinyl, roller-type shades are optimal. Remove all non-washable stuffed toys from the bedroom.  Wash stuffed toys weekly like sheets and blankets above.   Reduce indoor humidity to less than 50%.  Inexpensive humidity monitors can be purchased at most hardware stores.  Do not use a humidifier as can make the problem worse and are not recommended.   Control of Cockroach Allergen  Cockroach allergen has been identified as an important cause of acute attacks of asthma,  especially in urban settings.  There are fifty-five species of cockroach that exist in the [place], however only three, the [place], [place] species produce allergen that can affect patients with Asthma.  Allergens can be obtained from fecal particles, egg casings and secretions from cockroaches.    Remove food sources. Reduce access to water. Seal access and entry points. Spray runways with 0.5-1% Diazinon or Chlorpyrifos Blow boric acid power under stoves and refrigerator. Place bait stations (hydramethylnon) at feeding sites.  Allergy Shots   Allergies are the result of a chain reaction that starts in the immune system. Your immune system controls how your body defends itself. For instance, if you have an allergy to pollen, your immune system identifies pollen as an invader or allergen. Your immune system overreacts by producing antibodies called Immunoglobulin E (IgE). These antibodies travel to cells that release chemicals, causing an allergic reaction.  The concept behind allergy immunotherapy, whether it is received in the form of shots or tablets, is that the immune system can be desensitized to specific allergens that trigger allergy symptoms. Although it requires time and patience, the payback can be long-term relief.  How Do Allergy Shots Work?  Allergy shots work much like a vaccine. Your body responds to injected amounts of a particular allergen given in increasing doses, eventually developing a resistance and tolerance to it. Allergy shots can lead to decreased, minimal or  no allergy symptoms.  There generally are two phases: build-up and maintenance. Build-up often ranges from three to six months and involves receiving injections with increasing amounts of the allergens. The shots are typically given once or twice a week, though more rapid build-up schedules are sometimes used.  The maintenance phase begins when the most effective dose is reached. This dose is  different for each person, depending on how allergic you are and your response to the build-up injections. Once the maintenance dose is reached, there are longer periods between injections, typically two to four weeks.  Occasionally doctors give cortisone-type shots that can temporarily reduce allergy symptoms. These types of shots are different and should not be confused with allergy immunotherapy shots.  Who Can Be Treated with Allergy Shots?  Allergy shots may be a good treatment approach for people with allergic rhinitis (hay fever), allergic asthma, conjunctivitis (eye allergy) or stinging insect allergy.   Before deciding to begin allergy shots, you should consider:   The length of allergy season and the severity of your symptoms  Whether medications and/or changes to your environment can control your symptoms  Your desire to avoid long-term medication use  Time: allergy immunotherapy requires a major time commitment  Cost: may vary depending on your insurance coverage  Allergy shots for children age 26 and older are effective and often well tolerated. They might prevent the onset of new allergen sensitivities or the progression to asthma.  Allergy shots are not started on patients who are pregnant but can be continued on patients who become pregnant while receiving them. In some patients with other medical conditions or who take certain common medications, allergy shots may be of risk. It is important to mention other medications you talk to your allergist.   When Will I Feel Better?  Some may experience decreased allergy symptoms during the build-up phase. For others, it may take as long as 12 months on the maintenance dose. If there is no improvement after a year of maintenance, your allergist will discuss other treatment options with you.  If you aren't responding to allergy shots, it may be because there is not enough dose of the allergen in your vaccine or there are missing  allergens that were not identified during your allergy testing. Other reasons could be that there are high levels of the allergen in your environment or major exposure to non-allergic triggers like tobacco smoke.  What Is the Length of Treatment?  Once the maintenance dose is reached, allergy shots are generally continued for three to five years. The decision to stop should be discussed with your allergist at that time. Some people may experience a permanent reduction of allergy symptoms. Others may relapse and a longer course of allergy shots can be considered.  What Are the Possible Reactions?  The two types of adverse reactions that can occur with allergy shots are local and systemic. Common local reactions include very mild redness and swelling at the injection site, which can happen immediately or several hours after. A systemic reaction, which is less common, affects the entire body or a particular body system. They are usually mild and typically respond quickly to medications. Signs include increased allergy symptoms such as sneezing, a stuffy nose or hives.  Rarely, a serious systemic reaction called anaphylaxis can develop. Symptoms include swelling in the throat, wheezing, a feeling of tightness in the chest, nausea or dizziness. Most serious systemic reactions develop within 30 minutes of allergy shots. This is why it  is strongly recommended you wait in your doctor's office for 30 minutes after your injections. Your allergist is trained to watch for reactions, and his or her staff is trained and equipped with the proper medications to identify and treat them.  Who Should Administer Allergy Shots?  The preferred location for receiving shots is your prescribing allergist's office. Injections can sometimes be given at another facility where the physician and staff are trained to recognize and treat reactions, and have received instructions by your prescribing allergist.

## 2021-09-16 NOTE — Progress Notes (Signed)
NEW PATIENT  Date of Service/Encounter:  09/16/21  Consult requested by: Irving Shows, PA-C   Assessment:   No diagnosis found.  Plan/Recommendations:    There are no Patient Instructions on file for this visit.   {Blank single:19197::"This note in its entirety was forwarded to the Provider who requested this consultation."}  Subjective:   Travis [name] is a 36 y.o. male presenting today for evaluation of  Chief Complaint  Patient presents with  . Allergy Testing    Environmental Feels funny with pork and bloaty.    Travis [name] has a history of the following: Patient Active Problem List   Diagnosis Date Noted  . Ingrown nail 04/04/2013  . Painful legs and moving toes of left foot 04/04/2013  . Allergic rhinitis 08/25/2012  . SEBORRHEIC DERMATITIS 01/30/2010  . ATOPIC RHINITIS 03/01/2009  . ANXIETY 01/03/2008  . DEPRESSION 01/03/2008  . Mild persistent asthma in adult without complication 59/74/1638  . PEPTIC ULCER DISEASE 01/03/2008    History obtained from: chart review and {Persons; PED relatives w/patient:19415::"patient"}.  Travis [name] was referred by Irving Shows, PA-C.     Travis [name] is a 36 y.o. male presenting for {Blank single:19197::"a food challenge","a drug challenge","skin testing","a sick visit","an evaluation of ***","a follow up visit"}.   Asthma/Respiratory Symptom History: He has a history of pneumothorax. He was coughing very hard and was smoking.  He had a chest tube and was in the hospital for a few days. He had another one a few years later and this was just managed in the ED. He might have asthma. It only seems to be a problem when he goes to bed in the evening. It is sometimes in the morning as well. But the day time is non-existent. It does become a problem with physical activity. He does have albuterol and he uses it before he goes to bed at night. This does help. And then he does not need it any more. He did have Breo  for a while and he felt better the constant use. He does not like to use anything routinely.   Allergic Rhinitis Symptom History: He is allergic to cats and dogs. They have both of these at the home. He had allergy testing done when he was a child and he was allergic to everything. He was on shots when he was a child. But he did not continue them for a long time since they were expensive. He has levocetirizine or cetirizine which provides some relief. He also has allergy eye drops that tend to make things worse.  There are some nose sprays but this is not something that he continued for a long period of time.   Food Allergy Symptom History: He reports that he feels bloated within an hour. He has not had tick bites. He denies hives at all. He tolerates other red meats without a problem. He  does not eat venison at all.   GERD Symptom History: He takes omeprazole $RemoveBeforeDE'40mg'iCchYsQRzREDtcY$  BID for this.  He did see someone at [facility] (Dr. Rica Koyanagi), but she retired. After the last visit, she said to follow up as needed. He had an endoscopy in July 2016 that was normal. There was never a sign of Barrett's esophagus. He has only tried omeprazole. It worked and he continued with it.   He has a number if injuries. Despite all of this, he has never had any broken bones. He was working at [employer] for 11 years.  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  Otherwise, there is no history of other atopic diseases, including {Blank multiple:19196:o:"asthma","food allergies","drug allergies","environmental allergies","stinging insect allergies","eczema","urticaria","contact dermatitis"}. There is no significant infectious history. ***Vaccinations are up to date.    Past Medical History: Patient Active Problem List   Diagnosis Date Noted  . Ingrown nail 04/04/2013  . Painful legs and moving toes of left foot 04/04/2013  . Allergic rhinitis 08/25/2012  . SEBORRHEIC DERMATITIS 01/30/2010  . ATOPIC RHINITIS 03/01/2009  . ANXIETY  01/03/2008  . DEPRESSION 01/03/2008  . Mild persistent asthma in adult without complication 41/93/7902  . PEPTIC ULCER DISEASE 01/03/2008    Medication List:  Allergies as of 09/16/2021   No Known Allergies      Medication List        Accurate as of September 16, 2021  2:12 PM. If you have any questions, ask your nurse or doctor.          alprazolam 2 MG tablet Commonly known as: XANAX Take 2 mg by mouth 2 (two) times daily.   amoxicillin 500 MG capsule Commonly known as: AMOXIL Take 1 capsule (500 mg total) by mouth 3 (three) times daily till gone   Breo Ellipta 100-25 MCG/INH Aepb Generic drug: fluticasone furoate-vilanterol Inhale 1 puff into the lungs daily.   calcium carbonate 500 MG chewable tablet Commonly known as: TUMS - dosed in mg elemental calcium Chew 1 tablet by mouth daily. HAS BEEN TAKING 15 A DAY SINCE OUT OF OMEPRAZOLE   cetirizine 10 MG tablet Commonly known as: ZYRTEC Take 10 mg by mouth daily.   ibuprofen 200 MG tablet Commonly known as: ADVIL Take 400 mg by mouth as needed for headache (headache).   ibuprofen 800 MG tablet Commonly known as: ADVIL Take 1 tablet (800 mg total) by mouth every 8 (eight) hours with food if needed for pain   levocetirizine 5 MG tablet Commonly known as: XYZAL TAKE 1 TABLET BY MOUTH EVERY EVENING What changed:  how much to take how to take this when to take this   Melatonin 3 MG Caps Take 3 mg by mouth at bedtime.   metoprolol succinate 25 MG 24 hr tablet Commonly known as: TOPROL-XL TAKE 1 TABLET BY MOUTH DAILY   metoprolol succinate 25 MG 24 hr tablet Commonly known as: TOPROL-XL TAKE 1 TABLET BY MOUTH ONCE DAILY.   MULTIVITAMIN & MINERAL PO Take 1 tablet by mouth as needed.   omeprazole 40 MG capsule Commonly known as: PRILOSEC TAKE 1 CAPSULE BY MOUTH 2 TIMES DAILY.   omeprazole 40 MG capsule Commonly known as: PRILOSEC TAKE ONE CAPSULE (40 MG DOSE) BY MOUTH 2 (TWO) TIMES DAILY.    omeprazole 40 MG capsule Commonly known as: PRILOSEC Take 1 capsule (40 mg total) by mouth 2 (two) times daily.   omeprazole 40 MG capsule Commonly known as: PRILOSEC Take 1 capsule (40 mg total) by mouth 2 (two) times daily.   omeprazole 40 MG capsule Commonly known as: PRILOSEC Take 1 capsule (40 mg total) by mouth 2 (two) times daily.   albuterol 108 (90 Base) MCG/ACT inhaler Commonly known as: VENTOLIN HFA Inhale into the lungs.   ProAir HFA 108 (90 Base) MCG/ACT inhaler Generic drug: albuterol INHALE 2 PUFFS INTO THE LUNGS EVERY 4 HOURS AS NEEDED FOR WHEEZING.   albuterol (2.5 MG/3ML) 0.083% nebulizer solution Commonly known as: PROVENTIL        Birth History: {Blank single:19197::"non-contributory","born premature and spent time in the  NICU","born at term without complications"}  Developmental History: Issai has met all milestones on time. He has required no {Blank multiple:19196:a:"speech therapy","occupational therapy","physical therapy"}. ***non-contributory  Past Surgical History: Past Surgical History:  Procedure Laterality Date  . PLEURAL SCARIFICATION  2007, 03/26/12   x2     Family History: Family History  Problem Relation Age of Onset  . Heart attack Mother   . Diabetes Mother   . Prostate cancer Father      Social History: Mandell lives at home with ***.      ROS     Objective:   There were no vitals taken for this visit. There is no height or weight on file to calculate BMI.     Physical Exam   Diagnostic studies:    Spirometry: results normal (FEV1: 4.67/118%, FVC: 5.40/112%, FEV1/FVC: 86%).    Spirometry consistent with normal pattern. {Blank single:19197::"Albuterol/Atrovent nebulizer","Xopenex/Atrovent nebulizer","Albuterol nebulizer","Albuterol four puffs via MDI","Xopenex four puffs via MDI"} treatment given in clinic with {Blank single:19197::"significant improvement in FEV1 per ATS criteria","significant improvement in  FVC per ATS criteria","significant improvement in FEV1 and FVC per ATS criteria","improvement in FEV1, but not significant per ATS criteria","improvement in FVC, but not significant per ATS criteria","improvement in FEV1 and FVC, but not significant per ATS criteria","no improvement"}.  Allergy Studies: {Blank single:19197::"none","labs sent instead"," "}    {Blank single:19197::"Allergy testing results were read and interpreted by myself, documented by clinical staff."," "}         Salvatore Marvel, MD [facility] [place] [facility]

## 2021-09-17 ENCOUNTER — Encounter: Payer: Self-pay | Admitting: Allergy & Immunology

## 2021-09-17 ENCOUNTER — Other Ambulatory Visit (HOSPITAL_COMMUNITY): Payer: Self-pay

## 2021-09-17 MED ORDER — CARBINOXAMINE MALEATE 4 MG PO TABS
4.0000 mg | ORAL_TABLET | Freq: Three times a day (TID) | ORAL | 5 refills | Status: AC | PRN
  Filled 2021-09-17: qty 60, 20d supply, fill #0

## 2021-09-17 MED ORDER — FAMOTIDINE 40 MG PO TABS
40.0000 mg | ORAL_TABLET | Freq: Every day | ORAL | 5 refills | Status: DC
  Filled 2021-09-17: qty 30, 30d supply, fill #0
  Filled 2021-11-06: qty 30, 30d supply, fill #1
  Filled 2021-11-06: qty 30, 30d supply, fill #0

## 2021-09-17 MED ORDER — BUDESONIDE-FORMOTEROL FUMARATE 160-4.5 MCG/ACT IN AERO
2.0000 | INHALATION_SPRAY | Freq: Every evening | RESPIRATORY_TRACT | 5 refills | Status: DC
  Filled 2021-09-17: qty 10.2, 60d supply, fill #0

## 2021-09-17 MED ORDER — OMEPRAZOLE 40 MG PO CPDR
40.0000 mg | DELAYED_RELEASE_CAPSULE | Freq: Every day | ORAL | 5 refills | Status: DC
Start: 2021-09-17 — End: 2021-11-07
  Filled 2021-09-17: qty 30, 30d supply, fill #0

## 2021-09-19 ENCOUNTER — Other Ambulatory Visit (HOSPITAL_COMMUNITY): Payer: Self-pay

## 2021-09-19 MED ORDER — FAMOTIDINE 40 MG PO TABS: 40.0000 mg | ORAL_TABLET | Freq: Every day | ORAL | 5 refills | Status: DC

## 2021-09-19 MED ORDER — CARBINOXAMINE MALEATE 4 MG PO TABS: 4.0000 mg | ORAL_TABLET | Freq: Three times a day (TID) | ORAL | 5 refills | Status: DC | PRN

## 2021-09-19 MED ORDER — OMEPRAZOLE 40 MG PO CPDR
40.0000 mg | DELAYED_RELEASE_CAPSULE | Freq: Every day | ORAL | 5 refills | Status: DC
  Filled 2021-10-23 (×2): qty 30, 30d supply, fill #0

## 2021-09-19 MED ORDER — SYMBICORT 160-4.5 MCG/ACT IN AERO
2.0000 | INHALATION_SPRAY | Freq: Every evening | RESPIRATORY_TRACT | 5 refills | Status: DC
  Filled 2021-09-19: qty 10.2, fill #0

## 2021-09-19 NOTE — Addendum Note (Signed)
Addended by: Dollene Cleveland R on: 09/19/2021 04:29 PM   Modules accepted: Orders

## 2021-09-23 ENCOUNTER — Other Ambulatory Visit (HOSPITAL_COMMUNITY): Payer: Self-pay

## 2021-09-25 ENCOUNTER — Other Ambulatory Visit: Payer: Self-pay | Admitting: *Deleted

## 2021-09-25 ENCOUNTER — Telehealth: Payer: Self-pay | Admitting: *Deleted

## 2021-09-25 ENCOUNTER — Other Ambulatory Visit (HOSPITAL_COMMUNITY): Payer: Self-pay

## 2021-09-25 MED ORDER — ZADITOR 0.025 % OP SOLN
1.0000 [drp] | Freq: Two times a day (BID) | OPHTHALMIC | 5 refills | Status: AC | PRN
  Filled 2021-09-25: qty 5, 50d supply, fill #0

## 2021-09-25 NOTE — Telephone Encounter (Signed)
PA has been submitted through CoverMyMeds for Zaditor and is currently pending approval/denial.

## 2021-09-26 NOTE — Telephone Encounter (Signed)
I talked to [facility] wife and she said she would just purchase OTC.  Malachi Bonds, MD [facility] [place]

## 2021-09-26 NOTE — Telephone Encounter (Signed)
Zaditor 0.025% (0.035%) drops PA....  KEY: [id number] - PA Case ID: [id number] - [id number]  Denied: Zaditor (Ketotifen) is classified as an over the counter medication. This means that the requested medication is available over the counter without a prescription. Because of this, it is not covered under your pharmacy benefit.   Forwarding message to provider for alternative.

## 2021-09-30 ENCOUNTER — Other Ambulatory Visit (HOSPITAL_COMMUNITY): Payer: Self-pay

## 2021-10-23 ENCOUNTER — Other Ambulatory Visit (HOSPITAL_COMMUNITY): Payer: Self-pay

## 2021-11-06 ENCOUNTER — Other Ambulatory Visit (HOSPITAL_COMMUNITY): Payer: Self-pay

## 2021-11-07 ENCOUNTER — Other Ambulatory Visit (HOSPITAL_COMMUNITY): Payer: Self-pay

## 2021-11-07 MED ORDER — OMEPRAZOLE 40 MG PO CPDR
40.0000 mg | DELAYED_RELEASE_CAPSULE | Freq: Two times a day (BID) | ORAL | 2 refills | Status: DC
Start: 2021-11-07 — End: 2022-08-07
  Filled 2021-11-07 (×2): qty 180, 90d supply, fill #0
  Filled 2022-02-09: qty 180, 90d supply, fill #1
  Filled 2022-05-11 (×2): qty 180, 90d supply, fill #2

## 2021-11-07 NOTE — Addendum Note (Signed)
Addended by: [name] on: 11/07/2021 09:14 AM   Modules accepted: Orders

## 2021-11-07 NOTE — Telephone Encounter (Signed)
Patient reported that he is not having adequate control with the PPI daily + H2 blocker daily. I am changing him back to omeprazole 40mg  BID.   Sent to [facility].   Salvatore Marvel, MD [facility] and [place] [facility] [place]

## 2021-12-08 DIAGNOSIS — F3181 Bipolar II disorder: Secondary | ICD-10-CM | POA: Diagnosis not present

## 2021-12-08 DIAGNOSIS — J4530 Mild persistent asthma, uncomplicated: Secondary | ICD-10-CM | POA: Diagnosis not present

## 2021-12-08 DIAGNOSIS — F411 Generalized anxiety disorder: Secondary | ICD-10-CM | POA: Diagnosis not present

## 2021-12-08 DIAGNOSIS — R7989 Other specified abnormal findings of blood chemistry: Secondary | ICD-10-CM | POA: Diagnosis not present

## 2021-12-08 DIAGNOSIS — Z0000 Encounter for general adult medical examination without abnormal findings: Secondary | ICD-10-CM | POA: Diagnosis not present

## 2021-12-10 DIAGNOSIS — Z0000 Encounter for general adult medical examination without abnormal findings: Secondary | ICD-10-CM | POA: Diagnosis not present

## 2021-12-10 DIAGNOSIS — F3181 Bipolar II disorder: Secondary | ICD-10-CM | POA: Diagnosis not present

## 2021-12-10 DIAGNOSIS — R7989 Other specified abnormal findings of blood chemistry: Secondary | ICD-10-CM | POA: Diagnosis not present

## 2021-12-10 DIAGNOSIS — F411 Generalized anxiety disorder: Secondary | ICD-10-CM | POA: Diagnosis not present

## 2021-12-26 ENCOUNTER — Other Ambulatory Visit (HOSPITAL_COMMUNITY): Payer: Self-pay

## 2022-02-09 ENCOUNTER — Other Ambulatory Visit (HOSPITAL_COMMUNITY): Payer: Self-pay

## 2022-02-10 ENCOUNTER — Other Ambulatory Visit (HOSPITAL_COMMUNITY): Payer: Self-pay

## 2022-02-23 ENCOUNTER — Telehealth: Payer: Self-pay | Admitting: Allergy & Immunology

## 2022-02-23 ENCOUNTER — Other Ambulatory Visit (HOSPITAL_COMMUNITY): Payer: Self-pay

## 2022-02-23 MED ORDER — AMOXICILLIN-POT CLAVULANATE 875-125 MG PO TABS
1.0000 | ORAL_TABLET | Freq: Two times a day (BID) | ORAL | 0 refills | Status: AC
  Filled 2022-02-23: qty 14, 7d supply, fill #0

## 2022-02-23 NOTE — Telephone Encounter (Signed)
Patient's wife called reporting over a week of sinus pain and pressure. I sent in Augmentin to preferred pharmacy. Patient aware.  Salvatore Marvel, MD [facility] [place] [facility] [place]

## 2022-03-01 ENCOUNTER — Emergency Department (HOSPITAL_COMMUNITY)
Admission: EM | Admit: 2022-03-01 | Discharge: 2022-03-01 | Disposition: A | Discharge status: Home / Self Care | Payer: Commercial Managed Care - PPO | Attending: Emergency Medicine | Admitting: Emergency Medicine

## 2022-03-01 ENCOUNTER — Other Ambulatory Visit: Payer: Self-pay

## 2022-03-01 ENCOUNTER — Emergency Department (HOSPITAL_COMMUNITY): Payer: Commercial Managed Care - PPO

## 2022-03-01 ENCOUNTER — Encounter (HOSPITAL_COMMUNITY): Payer: Self-pay | Admitting: Emergency Medicine

## 2022-03-01 DIAGNOSIS — R0602 Shortness of breath: Secondary | ICD-10-CM | POA: Diagnosis not present

## 2022-03-01 DIAGNOSIS — J069 Acute upper respiratory infection, unspecified: Secondary | ICD-10-CM | POA: Diagnosis not present

## 2022-03-01 DIAGNOSIS — R002 Palpitations: Secondary | ICD-10-CM | POA: Diagnosis not present

## 2022-03-01 DIAGNOSIS — R110 Nausea: Secondary | ICD-10-CM | POA: Insufficient documentation

## 2022-03-01 DIAGNOSIS — Z1152 Encounter for screening for COVID-19: Secondary | ICD-10-CM | POA: Insufficient documentation

## 2022-03-01 DIAGNOSIS — J45909 Unspecified asthma, uncomplicated: Secondary | ICD-10-CM | POA: Diagnosis not present

## 2022-03-01 LAB — RESP PANEL BY RT-PCR (RSV, FLU A&B, COVID)  RVPGX2
Influenza A by PCR: NEGATIVE
Influenza B by PCR: NEGATIVE
Resp Syncytial Virus by PCR: NEGATIVE
SARS Coronavirus 2 by RT PCR: NEGATIVE

## 2022-03-01 LAB — COMPREHENSIVE METABOLIC PANEL
ALT: 24 U/L (ref 0–44)
AST: 23 U/L (ref 15–41)
Albumin: 4 g/dL (ref 3.5–5.0)
Alkaline Phosphatase: 78 U/L (ref 38–126)
Anion gap: 10 (ref 5–15)
BUN: 9 mg/dL (ref 6–20)
CO2: 24 mmol/L (ref 22–32)
Calcium: 9 mg/dL (ref 8.9–10.3)
Chloride: 104 mmol/L (ref 98–111)
Creatinine, Ser: 1.04 mg/dL (ref 0.61–1.24)
GFR, Estimated: >60 mL/min (ref >60)
Glucose, Bld: 102 mg/dL — ABNORMAL HIGH (ref 70–99)
Potassium: 3.5 mmol/L (ref 3.5–5.1)
Sodium: 138 mmol/L (ref 135–145)
Total Bilirubin: 0.6 mg/dL (ref 0.3–1.2)
Total Protein: 6.5 g/dL (ref 6.5–8.1)

## 2022-03-01 LAB — CBC
HCT: 47.1 % (ref 39.0–52.0)
Hemoglobin: 16.8 g/dL (ref 13.0–17.0)
MCH: 31.5 pg (ref 26.0–34.0)
MCHC: 35.7 g/dL (ref 30.0–36.0)
MCV: 88.4 fL (ref 80.0–100.0)
Platelets: 211 10*3/uL (ref 150–400)
RBC: 5.33 MIL/uL (ref 4.22–5.81)
RDW: 12.3 % (ref 11.5–15.5)
WBC: 8.6 10*3/uL (ref 4.0–10.5)
nRBC: 0 % (ref 0.0–0.2)

## 2022-03-01 LAB — TROPONIN I (HIGH SENSITIVITY)
Troponin I (High Sensitivity): 5 ng/L (ref <18)
Troponin I (High Sensitivity): 3 ng/L (ref <18)

## 2022-03-01 MED ORDER — SODIUM CHLORIDE 0.9 % IV BOLUS
1000.0000 mL | Freq: Once | INTRAVENOUS | Status: AC
  Administered 2022-03-01: 1000 mL via INTRAVENOUS

## 2022-03-01 MED ORDER — IOHEXOL 350 MG/ML SOLN
75.0000 mL | Freq: Once | INTRAVENOUS | Status: AC | PRN
  Administered 2022-03-01: 75 mL via INTRAVENOUS

## 2022-03-01 NOTE — Discharge Instructions (Addendum)
Return for any problem.  ?

## 2022-03-01 NOTE — ED Provider Notes (Signed)
[name]   CSN: [id number] Arrival date & time: 03/01/22  [id number]     History  Chief Complaint  Patient presents with   Shortness of Breath   Palpitations    Travis [name] is a 37 y.o. male.  37 year old male with prior medical history as detailed below presents for evaluation.  Patient complains of intermittent mild shortness of breath and intermittent palpitations x 5 days.  Patient was evaluated earlier this week with similar complaints.  Patient was placed on a course of Augmentin for possible upper respiratory infection and sinusitis.  Patient reports that he has nearly completed course of Augmentin.  Patient reports that his symptoms have persisted.  He denies chest pain.  He denies fever.  The history is provided by the patient and medical records.       Home Medications Prior to Admission medications   Medication Sig Start Date End Date Taking? Authorizing Provider  albuterol (PROVENTIL) (2.5 MG/3ML) 0.083% nebulizer solution  09/28/16   [provider]  albuterol (VENTOLIN HFA) 108 (90 Base) MCG/ACT inhaler Inhale into the lungs.    [provider]  amoxicillin-clavulanate (AUGMENTIN) 875-125 MG tablet Take 1 tablet by mouth 2 (two) times daily for 7 days. 02/23/22 03/02/22  [name], MD  budesonide-formoterol [facility]) 160-4.5 MCG/ACT inhaler Inhale 2 puffs into the lungs at bedtime. 09/17/21   [name], MD  Carbinoxamine Maleate 4 MG TABS Take 1 tablet (4 mg total) by mouth every 8 (eight) hours as needed. 09/17/21 10/17/21  [name], MD  Carbinoxamine Maleate 4 MG TABS Take 1 tablet (4 mg total) by mouth every 8 (eight) hours as needed. 09/19/21   [name], MD  Melatonin 3 MG CAPS Take 3 mg by mouth at bedtime.    [provider]  omeprazole (PRILOSEC) 40 MG capsule Take 1 capsule (40 mg total) by mouth in the morning and at bedtime.  11/07/21 05/11/22  [name], MD  SYMBICORT 160-4.5 MCG/ACT inhaler Inhale 2 puffs into the lungs at bedtime. 09/19/21   [name], MD  ZADITOR 0.025 % ophthalmic solution Place 1 drop into both eyes 2 (two) times daily as needed. 09/25/21   [name], MD      Allergies    Patient has no known allergies.    Review of Systems   Review of Systems  All other systems reviewed and are negative.   Physical Exam Updated Vital Signs BP 135/88 (BP Location: Right Arm)   Pulse 84   Temp 97.8 F (36.6 C) (Oral)   Resp 18   SpO2 97%  Physical Exam Vitals and nursing note reviewed.  Constitutional:      General: He is not in acute distress.    Appearance: Normal appearance. He is well-developed.  HENT:     Head: Normocephalic and atraumatic.  Eyes:     Conjunctiva/sclera: Conjunctivae normal.     Pupils: Pupils are equal, round, and reactive to light.  Cardiovascular:     Rate and Rhythm: Normal rate and regular rhythm.     Heart sounds: Normal heart sounds.  Pulmonary:     Effort: Pulmonary effort is normal. No respiratory distress.     Breath sounds: Normal breath sounds.  Abdominal:     General: There is no distension.     Palpations: Abdomen is soft.     Tenderness: There is no abdominal tenderness.  Musculoskeletal:  General: No deformity. Normal range of motion.     Cervical back: Normal range of motion and neck supple.  Skin:    General: Skin is warm and dry.  Neurological:     General: No focal deficit present.     Mental Status: He is alert and oriented to person, place, and time.     ED Results / Procedures / Treatments   Labs (all labs ordered are listed, but only abnormal results are displayed) Labs Reviewed  COMPREHENSIVE METABOLIC PANEL - Abnormal; Notable for the following components:      Result Value   Glucose, Bld 102 (*)    All other components within normal limits  RESP PANEL BY RT-PCR (RSV, FLU A&B, COVID)   RVPGX2  CBC  TROPONIN I (HIGH SENSITIVITY)  TROPONIN I (HIGH SENSITIVITY)    EKG EKG Interpretation  Date/Time:  Sunday March 01 2022 07:40:56 EST Ventricular Rate:  108 PR Interval:  132 QRS Duration: 112 QT Interval:  318 QTC Calculation: 426 R Axis:   85 Text Interpretation: Sinus tachycardia ST & T wave abnormality, consider inferolateral ischemia Abnormal ECG When compared with ECG of 26-Mar-2012 17:45, PREVIOUS ECG IS PRESENT Confirmed by [name] [phone]) on 03/01/2022 8:33:56 AM  Radiology CT Angio Chest PE W and/or Wo Contrast  Result Date: 03/01/2022 CLINICAL DATA:  37 year old male with acute shortness of breath and palpitations for 5 days. EXAM: CT ANGIOGRAPHY CHEST WITH CONTRAST TECHNIQUE: Multidetector CT imaging of the chest was performed using the standard protocol during bolus administration of intravenous contrast. Multiplanar CT image reconstructions and MIPs were obtained to evaluate the vascular anatomy. RADIATION DOSE REDUCTION: This exam was performed according to the departmental dose-optimization program which includes automated exposure control, adjustment of the mA and/or kV according to patient size and/or use of iterative reconstruction technique. CONTRAST:  39m OMNIPAQUE IOHEXOL 350 MG/ML SOLN COMPARISON:  12/26/2007 chest CT.  03/01/2022 chest radiograph FINDINGS: Cardiovascular: Satisfactory opacification of the pulmonary arteries to the segmental level. No evidence of pulmonary embolism. Normal heart size. No thoracic aortic aneurysm or pericardial effusion. Mediastinum/Nodes: No enlarged mediastinal, hilar, or axillary lymph nodes. Thyroid gland, trachea, and esophagus demonstrate no significant findings. Lungs/Pleura: Lungs are clear. Biapical blebs are noted. No pleural effusion or pneumothorax. Upper Abdomen: No acute abnormality Musculoskeletal: No acute or suspicious bony abnormalities are noted. Review of the MIP images confirms the above findings.  IMPRESSION: No evidence of acute abnormality. No evidence of pulmonary emboli. Electronically Signed   By: [name]   On: 03/01/2022 10:46   DG Chest 2 View  Result Date: 03/01/2022 CLINICAL DATA:  Shortness of breath.  Asthma. EXAM: CHEST - 2 VIEW COMPARISON:  10/05/2016 FINDINGS: The heart size and mediastinal contours are within normal limits. Both lungs are clear. The visualized skeletal structures are unremarkable. IMPRESSION: No active cardiopulmonary disease. Electronically Signed   By: EMisty StanleyM.D.   On: 03/01/2022 08:18    Procedures Procedures    Medications Ordered in ED Medications  sodium chloride 0.9 % bolus 1,000 mL (0 mLs Intravenous Stopped 03/01/22 1134)  iohexol (OMNIPAQUE) 350 MG/ML injection 75 mL (75 mLs Intravenous Contrast Given 03/01/22 1035)    ED Course/ Medical Decision Making/ A&P                             Medical Decision Making Amount and/or Complexity of Data Reviewed Labs: ordered. Radiology: ordered.  Risk Prescription drug  management.    Medical Screen Complete  This patient presented to the ED with complaint of palpitations and intermittent shortness of breath.  This complaint involves an extensive number of treatment options. The initial differential diagnosis includes, but is not limited to, metabolic abnormality, bacterial versus viral infection, arrhythmia, etc.  This presentation is: Acute, Self-Limited, Previously Undiagnosed, Uncertain Prognosis, Complicated, Systemic Symptoms, and Threat to Life/Bodily Function  Patient is presenting with complaint of URI symptoms with associated palpitations, cough, intermittent shortness of breath.  Patient is nearing completion of course of outpatient Augmentin for treatment of possible sinusitis.  Screening labs obtained here in the ED are without significant abnormality.  Patient does feel significantly improved.  Imaging obtained in the ED is without significant  abnormality.  Suspect that majority of patient's reported symptoms are secondary to acute viral process.  However, patient is concerned about his experience palpitations.  He is requesting referral to cardiology for further evaluation. Patient is appropriate for discharge.  Cardiology referral made.  Patient understands need for close outpatient follow-up.  Strict return precautions given and understood.   Additional history obtained:  External records from outside sources obtained and reviewed including prior ED visits and prior Inpatient records.    Lab Tests:  I ordered and personally interpreted labs.  The pertinent results include: CBC, CMP, COVID, flu, troponin x 2   Imaging Studies ordered:  I ordered imaging studies including chest x-ray, CT angio chest I independently visualized and interpreted obtained imaging which showed NAD I agree with the radiologist interpretation.   Cardiac Monitoring:  The patient was maintained on a cardiac monitor.  I personally viewed and interpreted the cardiac monitor which showed an underlying rhythm of: NSR   Medicines ordered:  I ordered medication including IV fluids for suspected mild dehydration Reevaluation of the patient after these medicines showed that the patient: improved   Problem List / ED Course:  Palpitations   Reevaluation:  After the interventions noted above, I reevaluated the patient and found that they have: improved   Disposition:  After consideration of the diagnostic results and the patients response to treatment, I feel that the patent would benefit from close outpatient follow-up.          Final Clinical Impression(s) / ED Diagnoses Final diagnoses:  Palpitations    Rx / DC Orders ED Discharge Orders          Ordered    Ambulatory referral to Cardiology       Comments: If you have not heard from the Cardiology office within the next 72 hours please call [phone].   03/01/22 1214               Valarie Merino, MD 03/01/22 1623

## 2022-03-01 NOTE — ED Triage Notes (Signed)
Pt here from home with c/o sob and palpitations over the last 5 days , pt with some nausea no vomiting , pt has been using his inhaler more over the last few days

## 2022-03-01 NOTE — ED Notes (Signed)
Patient transported to CT 

## 2022-03-16 ENCOUNTER — Ambulatory Visit: Payer: Commercial Managed Care - PPO | Admitting: Cardiology

## 2022-03-26 ENCOUNTER — Encounter: Payer: Self-pay | Admitting: General Practice

## 2022-05-11 ENCOUNTER — Other Ambulatory Visit (HOSPITAL_COMMUNITY): Payer: Self-pay

## 2022-06-24 DIAGNOSIS — M9901 Segmental and somatic dysfunction of cervical region: Secondary | ICD-10-CM | POA: Diagnosis not present

## 2022-06-24 DIAGNOSIS — M9902 Segmental and somatic dysfunction of thoracic region: Secondary | ICD-10-CM | POA: Diagnosis not present

## 2022-06-24 DIAGNOSIS — M50122 Cervical disc disorder at C5-C6 level with radiculopathy: Secondary | ICD-10-CM | POA: Diagnosis not present

## 2022-06-24 DIAGNOSIS — M5384 Other specified dorsopathies, thoracic region: Secondary | ICD-10-CM | POA: Diagnosis not present

## 2022-06-26 ENCOUNTER — Other Ambulatory Visit (HOSPITAL_COMMUNITY): Payer: Self-pay

## 2022-06-26 DIAGNOSIS — M542 Cervicalgia: Secondary | ICD-10-CM | POA: Diagnosis not present

## 2022-06-26 DIAGNOSIS — M5412 Radiculopathy, cervical region: Secondary | ICD-10-CM | POA: Diagnosis not present

## 2022-06-26 MED ORDER — TRAMADOL HCL 50 MG PO TABS
ORAL_TABLET | ORAL | 0 refills | Status: DC
  Filled 2022-06-26: qty 3, 1d supply, fill #0

## 2022-06-26 MED ORDER — PREDNISONE 20 MG PO TABS
ORAL_TABLET | ORAL | 0 refills | Status: DC
  Filled 2022-06-26: qty 11, 9d supply, fill #0

## 2022-06-26 MED ORDER — CYCLOBENZAPRINE HCL 10 MG PO TABS
ORAL_TABLET | ORAL | 0 refills | Status: DC
  Filled 2022-06-26: qty 30, 10d supply, fill #0

## 2022-06-29 ENCOUNTER — Other Ambulatory Visit (HOSPITAL_COMMUNITY): Payer: Self-pay

## 2022-06-29 MED ORDER — TRAMADOL HCL 50 MG PO TABS
ORAL_TABLET | ORAL | 0 refills | Status: DC
  Filled 2022-06-29: qty 9, 3d supply, fill #0

## 2022-06-29 MED ORDER — CYCLOBENZAPRINE HCL 10 MG PO TABS: ORAL_TABLET | ORAL | 0 refills | Status: DC

## 2022-07-02 DIAGNOSIS — M50122 Cervical disc disorder at C5-C6 level with radiculopathy: Secondary | ICD-10-CM | POA: Diagnosis not present

## 2022-07-02 DIAGNOSIS — M9902 Segmental and somatic dysfunction of thoracic region: Secondary | ICD-10-CM | POA: Diagnosis not present

## 2022-07-02 DIAGNOSIS — M9901 Segmental and somatic dysfunction of cervical region: Secondary | ICD-10-CM | POA: Diagnosis not present

## 2022-07-02 DIAGNOSIS — M5384 Other specified dorsopathies, thoracic region: Secondary | ICD-10-CM | POA: Diagnosis not present

## 2022-07-06 ENCOUNTER — Other Ambulatory Visit (HOSPITAL_COMMUNITY): Payer: Self-pay

## 2022-07-06 DIAGNOSIS — M5384 Other specified dorsopathies, thoracic region: Secondary | ICD-10-CM | POA: Diagnosis not present

## 2022-07-06 DIAGNOSIS — M9901 Segmental and somatic dysfunction of cervical region: Secondary | ICD-10-CM | POA: Diagnosis not present

## 2022-07-06 DIAGNOSIS — M50122 Cervical disc disorder at C5-C6 level with radiculopathy: Secondary | ICD-10-CM | POA: Diagnosis not present

## 2022-07-06 DIAGNOSIS — M9902 Segmental and somatic dysfunction of thoracic region: Secondary | ICD-10-CM | POA: Diagnosis not present

## 2022-07-07 ENCOUNTER — Other Ambulatory Visit (HOSPITAL_COMMUNITY): Payer: Self-pay

## 2022-07-07 MED ORDER — CYCLOBENZAPRINE HCL 10 MG PO TABS
10.0000 mg | ORAL_TABLET | Freq: Three times a day (TID) | ORAL | 0 refills | Status: DC | PRN
  Filled 2022-07-07: qty 30, 10d supply, fill #0

## 2022-07-08 ENCOUNTER — Other Ambulatory Visit: Payer: Self-pay

## 2022-07-08 ENCOUNTER — Other Ambulatory Visit (HOSPITAL_COMMUNITY): Payer: Self-pay

## 2022-07-08 ENCOUNTER — Emergency Department (HOSPITAL_COMMUNITY)
Admission: EM | Admit: 2022-07-08 | Discharge: 2022-07-08 | Disposition: A | Discharge status: Home / Self Care | Payer: Commercial Managed Care - PPO | Attending: Emergency Medicine | Admitting: Emergency Medicine

## 2022-07-08 DIAGNOSIS — M542 Cervicalgia: Secondary | ICD-10-CM | POA: Diagnosis not present

## 2022-07-08 DIAGNOSIS — M5412 Radiculopathy, cervical region: Secondary | ICD-10-CM | POA: Diagnosis not present

## 2022-07-08 DIAGNOSIS — M5410 Radiculopathy, site unspecified: Secondary | ICD-10-CM

## 2022-07-08 MED ORDER — TRAMADOL HCL 50 MG PO TABS
50.0000 mg | ORAL_TABLET | Freq: Three times a day (TID) | ORAL | 0 refills | Status: DC | PRN
  Filled 2022-07-08: qty 15, 5d supply, fill #0

## 2022-07-08 NOTE — ED Provider Notes (Signed)
[facility] AT [facility] Provider Note   CSN: [id number] Arrival date & time: 07/08/22  0720     History  Chief Complaint  Patient presents with   Neck Pain    Travis [name] is a 37 y.o. male.  37 year old male who presents with ongoing radicular symptoms from known cervical disc disease.  Outside records reviewed and patient has been seen by extremity care doctor as well as a Land.  Patient has been on prednisone, Flexeril, tramadol.  He is receiving advanced chiropractic therapy currently.  States that the tramadol does help at times.  States that when he turns his head to the left it makes shooting pain going down his right arm.  Denies any wrist drop.  No bowel or bladder dysfunction.       Home Medications Prior to Admission medications   Medication Sig Start Date End Date Taking? Authorizing Provider  albuterol (PROVENTIL) (2.5 MG/3ML) 0.083% nebulizer solution  09/28/16   [provider]  albuterol (VENTOLIN HFA) 108 (90 Base) MCG/ACT inhaler Inhale into the lungs.    [provider]  budesonide-formoterol (SYMBICORT) 160-4.5 MCG/ACT inhaler Inhale 2 puffs into the lungs at bedtime. 09/17/21   Alfonse Spruce, MD  Carbinoxamine Maleate 4 MG TABS Take 1 tablet (4 mg total) by mouth every 8 (eight) hours as needed. 09/17/21 10/17/21  Alfonse Spruce, MD  Carbinoxamine Maleate 4 MG TABS Take 1 tablet (4 mg total) by mouth every 8 (eight) hours as needed. 09/19/21   Alfonse Spruce, MD  cyclobenzaprine (FLEXERIL) 10 MG tablet Take 1 tablet (10 mg) by mouth 3 times a day as needed for muscle spasms 06/29/22     cyclobenzaprine (FLEXERIL) 10 MG tablet Take 1 tablet by mouth 3 times daily as needed for muscle spasms for up to 10 days 07/06/22     Melatonin 3 MG CAPS Take 3 mg by mouth at bedtime.    [provider]  omeprazole (PRILOSEC) 40 MG capsule Take 1 capsule (40 mg total) by mouth in the morning  and at bedtime. 11/07/21 08/09/22  Alfonse Spruce, MD  predniSONE (DELTASONE) 20 MG tablet Take 2 tablets by mouth for 3 days then take 1 tablet daily for 3 days then take 1/2 tablet daily for 3 days 06/26/22     SYMBICORT 160-4.5 MCG/ACT inhaler Inhale 2 puffs into the lungs at bedtime. 09/19/21   Alfonse Spruce, MD  traMADol (ULTRAM) 50 MG tablet Take one tablet (50 mg dose) by mouth every 8 (eight) hours as needed for Pain for up to 3 days. Max Daily Amount: 150 mg 06/29/22     ZADITOR 0.025 % ophthalmic solution Place 1 drop into both eyes 2 (two) times daily as needed. 09/25/21   Alfonse Spruce, MD      Allergies    Patient has no known allergies.    Review of Systems   Review of Systems  All other systems reviewed and are negative.   Physical Exam Updated Vital Signs BP (!) 151/98 (BP Location: Left Arm)   Pulse (!) 104   Temp 97.6 F (36.4 C) (Oral)   Resp 18   Ht 1.702 m (5\' 7" )   Wt 88.5 kg   SpO2 100%   BMI 30.54 kg/m  Physical Exam Vitals and nursing note reviewed.  Constitutional:      General: He is not in acute distress.    Appearance: Normal appearance. He is well-developed. He  is not toxic-appearing.  HENT:     Head: Normocephalic and atraumatic.  Eyes:     General: Lids are normal.     Conjunctiva/sclera: Conjunctivae normal.     Pupils: Pupils are equal, round, and reactive to light.  Neck:     Thyroid: No thyroid mass.     Trachea: No tracheal deviation.  Cardiovascular:     Rate and Rhythm: Normal rate and regular rhythm.     Heart sounds: Normal heart sounds. No murmur heard.    No gallop.  Pulmonary:     Effort: Pulmonary effort is normal. No respiratory distress.     Breath sounds: Normal breath sounds. No stridor. No decreased breath sounds, wheezing, rhonchi or rales.  Abdominal:     General: There is no distension.     Palpations: Abdomen is soft.     Tenderness: There is no abdominal tenderness. There is no rebound.   Musculoskeletal:        General: No tenderness. Normal range of motion.     Cervical back: Normal range of motion and neck supple.  Skin:    General: Skin is warm and dry.     Findings: No abrasion or rash.  Neurological:     General: No focal deficit present.     Mental Status: He is alert and oriented to person, place, and time. Mental status is at baseline.     GCS: GCS eye subscore is 4. GCS verbal subscore is 5. GCS motor subscore is 6.     Cranial Nerves: No cranial nerve deficit.     Sensory: No sensory deficit.     Motor: Motor function is intact. No weakness.     Comments: Median, ulnar, radial nerves all intact on right side  Psychiatric:        Attention and Perception: Attention normal.        Speech: Speech normal.        Behavior: Behavior normal.     ED Results / Procedures / Treatments   Labs (all labs ordered are listed, but only abnormal results are displayed) Labs Reviewed - No data to display  EKG None  Radiology No results found.  Procedures Procedures    Medications Ordered in ED Medications - No data to display  ED Course/ Medical Decision Making/ A&P                             Medical Decision Making  Patient with known history of cervical disc disease.  Neurological exam today is normal.  No indications for other imaging.  Will give patient referral to neurosurgeon on-call        Final Clinical Impression(s) / ED Diagnoses Final diagnoses:  None    Rx / DC Orders ED Discharge Orders     None         Lorre Nick, MD 07/08/22 [phone]

## 2022-07-08 NOTE — ED Triage Notes (Signed)
Pt reports sharp pain on right neck that radiates down right arm. Pt states right hand and forearm feel numb x few weeks. Was told by chiropractor that it's a pinched nerve.

## 2022-07-09 DIAGNOSIS — M9902 Segmental and somatic dysfunction of thoracic region: Secondary | ICD-10-CM | POA: Diagnosis not present

## 2022-07-09 DIAGNOSIS — M50122 Cervical disc disorder at C5-C6 level with radiculopathy: Secondary | ICD-10-CM | POA: Diagnosis not present

## 2022-07-09 DIAGNOSIS — M5384 Other specified dorsopathies, thoracic region: Secondary | ICD-10-CM | POA: Diagnosis not present

## 2022-07-09 DIAGNOSIS — M9901 Segmental and somatic dysfunction of cervical region: Secondary | ICD-10-CM | POA: Diagnosis not present

## 2022-07-09 DIAGNOSIS — M5412 Radiculopathy, cervical region: Secondary | ICD-10-CM | POA: Diagnosis not present

## 2022-07-13 ENCOUNTER — Other Ambulatory Visit (HOSPITAL_COMMUNITY): Payer: Self-pay

## 2022-07-13 DIAGNOSIS — M50223 Other cervical disc displacement at C6-C7 level: Secondary | ICD-10-CM | POA: Diagnosis not present

## 2022-07-13 DIAGNOSIS — Z6831 Body mass index (BMI) 31.0-31.9, adult: Secondary | ICD-10-CM | POA: Diagnosis not present

## 2022-07-13 MED ORDER — TRAMADOL HCL 50 MG PO TABS
50.0000 mg | ORAL_TABLET | Freq: Four times a day (QID) | ORAL | 0 refills | Status: DC | PRN
  Filled 2022-07-13: qty 60, 15d supply, fill #0

## 2022-07-14 DIAGNOSIS — M5412 Radiculopathy, cervical region: Secondary | ICD-10-CM | POA: Diagnosis not present

## 2022-07-17 DIAGNOSIS — M5412 Radiculopathy, cervical region: Secondary | ICD-10-CM | POA: Diagnosis not present

## 2022-07-20 ENCOUNTER — Other Ambulatory Visit (HOSPITAL_COMMUNITY): Payer: Self-pay

## 2022-07-20 MED ORDER — IBUPROFEN 600 MG PO TABS
600.0000 mg | ORAL_TABLET | Freq: Four times a day (QID) | ORAL | 0 refills | Status: AC | PRN
  Filled 2022-07-20: qty 15, 4d supply, fill #0

## 2022-07-20 MED ORDER — AMOXICILLIN 500 MG PO CAPS
1000.0000 mg | ORAL_CAPSULE | ORAL | 0 refills | Status: DC
  Filled 2022-07-20: qty 22, 7d supply, fill #0

## 2022-07-20 MED ORDER — OXYCODONE-ACETAMINOPHEN 5-325 MG PO TABS
1.0000 | ORAL_TABLET | ORAL | 0 refills | Status: DC | PRN
  Filled 2022-07-20: qty 18, 3d supply, fill #0

## 2022-07-21 ENCOUNTER — Other Ambulatory Visit (HOSPITAL_COMMUNITY): Payer: Self-pay

## 2022-07-21 DIAGNOSIS — M5412 Radiculopathy, cervical region: Secondary | ICD-10-CM | POA: Diagnosis not present

## 2022-07-21 MED ORDER — CYCLOBENZAPRINE HCL 10 MG PO TABS
10.0000 mg | ORAL_TABLET | Freq: Three times a day (TID) | ORAL | 0 refills | Status: DC | PRN
  Filled 2022-07-21: qty 30, 10d supply, fill #0

## 2022-07-22 ENCOUNTER — Other Ambulatory Visit (HOSPITAL_COMMUNITY): Payer: Self-pay

## 2022-07-22 DIAGNOSIS — M50223 Other cervical disc displacement at C6-C7 level: Secondary | ICD-10-CM | POA: Diagnosis not present

## 2022-07-22 MED ORDER — METHYLPREDNISOLONE 4 MG PO TBPK
ORAL_TABLET | ORAL | 0 refills | Status: DC
  Filled 2022-07-22: qty 21, 6d supply, fill #0

## 2022-07-24 ENCOUNTER — Other Ambulatory Visit (HOSPITAL_COMMUNITY): Payer: Self-pay

## 2022-07-25 ENCOUNTER — Other Ambulatory Visit (HOSPITAL_COMMUNITY): Payer: Self-pay

## 2022-07-25 MED ORDER — TRAMADOL HCL 50 MG PO TABS
50.0000 mg | ORAL_TABLET | Freq: Four times a day (QID) | ORAL | 0 refills | Status: DC | PRN
  Filled 2022-07-27: qty 60, 15d supply, fill #0

## 2022-07-27 ENCOUNTER — Other Ambulatory Visit (HOSPITAL_COMMUNITY): Payer: Self-pay

## 2022-08-05 ENCOUNTER — Other Ambulatory Visit (HOSPITAL_COMMUNITY): Payer: Self-pay

## 2022-08-05 DIAGNOSIS — M50223 Other cervical disc displacement at C6-C7 level: Secondary | ICD-10-CM | POA: Diagnosis not present

## 2022-08-05 DIAGNOSIS — Z6830 Body mass index (BMI) 30.0-30.9, adult: Secondary | ICD-10-CM | POA: Diagnosis not present

## 2022-08-05 MED ORDER — OXYCODONE HCL 5 MG PO TABS
5.0000 mg | ORAL_TABLET | Freq: Four times a day (QID) | ORAL | 0 refills | Status: DC | PRN
  Filled 2022-08-05: qty 45, 12d supply, fill #0

## 2022-08-07 ENCOUNTER — Other Ambulatory Visit: Payer: Self-pay | Admitting: Allergy & Immunology

## 2022-08-07 ENCOUNTER — Other Ambulatory Visit: Payer: Self-pay

## 2022-08-07 ENCOUNTER — Other Ambulatory Visit (HOSPITAL_COMMUNITY): Payer: Self-pay

## 2022-08-07 MED ORDER — OMEPRAZOLE 40 MG PO CPDR
40.0000 mg | DELAYED_RELEASE_CAPSULE | Freq: Two times a day (BID) | ORAL | 2 refills | Status: DC
  Filled 2022-08-07: qty 180, 90d supply, fill #0
  Filled 2022-11-03: qty 180, 90d supply, fill #1
  Filled 2023-02-05: qty 180, 90d supply, fill #2

## 2022-08-12 ENCOUNTER — Other Ambulatory Visit (HOSPITAL_COMMUNITY): Payer: Self-pay

## 2022-08-13 ENCOUNTER — Other Ambulatory Visit (HOSPITAL_COMMUNITY): Payer: Self-pay

## 2022-08-14 ENCOUNTER — Other Ambulatory Visit (HOSPITAL_COMMUNITY): Payer: Self-pay

## 2022-08-17 ENCOUNTER — Other Ambulatory Visit (HOSPITAL_COMMUNITY): Payer: Self-pay

## 2022-08-18 ENCOUNTER — Other Ambulatory Visit (HOSPITAL_COMMUNITY): Payer: Self-pay

## 2022-08-19 ENCOUNTER — Other Ambulatory Visit (HOSPITAL_COMMUNITY): Payer: Self-pay

## 2022-08-20 ENCOUNTER — Other Ambulatory Visit (HOSPITAL_COMMUNITY): Payer: Self-pay

## 2022-08-20 MED ORDER — OXYCODONE HCL 5 MG PO TABS
5.0000 mg | ORAL_TABLET | Freq: Four times a day (QID) | ORAL | 0 refills | Status: DC | PRN
  Filled 2022-08-20: qty 45, 12d supply, fill #0

## 2022-08-25 ENCOUNTER — Emergency Department (HOSPITAL_COMMUNITY)
Admission: EM | Admit: 2022-08-25 | Discharge: 2022-08-25 | Disposition: A | Discharge status: Home / Self Care | Payer: Commercial Managed Care - PPO | Attending: Emergency Medicine | Admitting: Emergency Medicine

## 2022-08-25 ENCOUNTER — Encounter (HOSPITAL_COMMUNITY): Payer: Self-pay

## 2022-08-25 ENCOUNTER — Other Ambulatory Visit (HOSPITAL_COMMUNITY): Payer: Self-pay

## 2022-08-25 ENCOUNTER — Emergency Department (HOSPITAL_COMMUNITY): Payer: Commercial Managed Care - PPO

## 2022-08-25 DIAGNOSIS — R531 Weakness: Secondary | ICD-10-CM | POA: Diagnosis not present

## 2022-08-25 DIAGNOSIS — R200 Anesthesia of skin: Secondary | ICD-10-CM | POA: Insufficient documentation

## 2022-08-25 DIAGNOSIS — M5021 Other cervical disc displacement,  high cervical region: Secondary | ICD-10-CM | POA: Diagnosis not present

## 2022-08-25 DIAGNOSIS — M50223 Other cervical disc displacement at C6-C7 level: Secondary | ICD-10-CM | POA: Diagnosis not present

## 2022-08-25 DIAGNOSIS — M50222 Other cervical disc displacement at C5-C6 level: Secondary | ICD-10-CM | POA: Diagnosis not present

## 2022-08-25 DIAGNOSIS — M79601 Pain in right arm: Secondary | ICD-10-CM

## 2022-08-25 DIAGNOSIS — M542 Cervicalgia: Secondary | ICD-10-CM | POA: Diagnosis not present

## 2022-08-25 DIAGNOSIS — M4802 Spinal stenosis, cervical region: Secondary | ICD-10-CM | POA: Diagnosis not present

## 2022-08-25 DIAGNOSIS — J45909 Unspecified asthma, uncomplicated: Secondary | ICD-10-CM | POA: Insufficient documentation

## 2022-08-25 LAB — CBC WITH DIFFERENTIAL/PLATELET
Abs Immature Granulocytes: 0.02 10*3/uL (ref 0.00–0.07)
Basophils Absolute: 0 10*3/uL (ref 0.0–0.1)
Basophils Relative: 1 %
Eosinophils Absolute: 0.2 10*3/uL (ref 0.0–0.5)
Eosinophils Relative: 3 %
HCT: 43.5 % (ref 39.0–52.0)
Hemoglobin: 14.6 g/dL (ref 13.0–17.0)
Immature Granulocytes: 0 %
Lymphocytes Relative: 21 %
Lymphs Abs: 1.3 10*3/uL (ref 0.7–4.0)
MCH: 28.6 pg (ref 26.0–34.0)
MCHC: 33.6 g/dL (ref 30.0–36.0)
MCV: 85.1 fL (ref 80.0–100.0)
Monocytes Absolute: 0.7 10*3/uL (ref 0.1–1.0)
Monocytes Relative: 12 %
Neutro Abs: 3.9 10*3/uL (ref 1.7–7.7)
Neutrophils Relative %: 63 %
Platelets: 226 10*3/uL (ref 150–400)
RBC: 5.11 MIL/uL (ref 4.22–5.81)
RDW: 12.4 % (ref 11.5–15.5)
WBC: 6.2 10*3/uL (ref 4.0–10.5)
nRBC: 0 % (ref 0.0–0.2)

## 2022-08-25 LAB — BASIC METABOLIC PANEL
Anion gap: 10 (ref 5–15)
BUN: 13 mg/dL (ref 6–20)
CO2: 23 mmol/L (ref 22–32)
Calcium: 9 mg/dL (ref 8.9–10.3)
Chloride: 103 mmol/L (ref 98–111)
Creatinine, Ser: 0.96 mg/dL (ref 0.61–1.24)
GFR, Estimated: >60 mL/min (ref >60)
Glucose, Bld: 100 mg/dL — ABNORMAL HIGH (ref 70–99)
Potassium: 4.5 mmol/L (ref 3.5–5.1)
Sodium: 136 mmol/L (ref 135–145)

## 2022-08-25 MED ORDER — KETOROLAC TROMETHAMINE 15 MG/ML IJ SOLN
15.0000 mg | Freq: Once | INTRAMUSCULAR | Status: AC
  Administered 2022-08-25: 15 mg via INTRAVENOUS
  Filled 2022-08-25: qty 1

## 2022-08-25 MED ORDER — OXYCODONE HCL 10 MG PO TABS
10.0000 mg | ORAL_TABLET | Freq: Four times a day (QID) | ORAL | 0 refills | Status: DC | PRN
  Filled 2022-08-25: qty 60, 15d supply, fill #0

## 2022-08-25 MED ORDER — DIAZEPAM 5 MG PO TABS
5.0000 mg | ORAL_TABLET | Freq: Four times a day (QID) | ORAL | 0 refills | Status: DC | PRN
  Filled 2022-08-25: qty 30, 8d supply, fill #0

## 2022-08-25 MED ORDER — HYDROMORPHONE HCL 1 MG/ML IJ SOLN
1.0000 mg | Freq: Once | INTRAMUSCULAR | Status: AC
  Administered 2022-08-25: 1 mg via INTRAVENOUS
  Filled 2022-08-25: qty 1

## 2022-08-25 MED ORDER — OXYCODONE-ACETAMINOPHEN 5-325 MG PO TABS
1.0000 | ORAL_TABLET | ORAL | Status: AC | PRN
  Administered 2022-08-25 (×2): 1 via ORAL
  Filled 2022-08-25 (×2): qty 1

## 2022-08-25 MED ORDER — DEXAMETHASONE SODIUM PHOSPHATE 10 MG/ML IJ SOLN
10.0000 mg | Freq: Once | INTRAMUSCULAR | Status: AC
  Administered 2022-08-25: 10 mg via INTRAVENOUS
  Filled 2022-08-25: qty 1

## 2022-08-25 NOTE — ED Provider Notes (Signed)
  Physical Exam  BP (!) 141/8 (BP Location: Left Arm)   Pulse 99   Temp 98.6 F (37 C) (Oral)   Resp 18   SpO2 99%   Physical Exam  Procedures  Procedures  ED Course / MDM   Clinical Course as of 08/25/22 1934  Tue Aug 25, 2022  1308 Assumed care from Dr. Durwin Nora.  37 year old male with a history of degenerative disc disease who presented to the emergency department with worsening neck pain and some worsening of chronic RUE weakness and numbness.  MRI does show buldging disc with mass effect on the cord without signal change.  Patient given Decadron and neurosurgery has been consulted and will come see the patient this morning. [RP]  [phone] [facility] paged again but no response.  [RP]  [phone] Dr Franky Macho from [facility] will come to evaluate the patient shortly.  [RP]  1156 Dr Franky Macho has seen the patient.  Offer the patient admission for pain control.  Does not feel that he needs acute surgical intervention at this time they will continue to work with him about outpatient operative intervention.  He has prescribed him both Valium and oxycodone for his pain.  Patient instructed to follow-up with them as an outpatient and to return if he experiences any concerning neurologic symptoms. [RP]    Clinical Course User Index [RP] Rondel Baton, MD   Medical Decision Making Amount and/or Complexity of Data Reviewed Labs: ordered. Radiology: ordered.  Risk Prescription drug management.     Rondel Baton, MD 08/25/22 [phone]

## 2022-08-25 NOTE — Progress Notes (Signed)
Patient ID: Travis [name], male   DOB: 1985-04-08, 37 y.o.   MRN: [id number] BP (!) 149/76 (BP Location: Left Arm)   Pulse (!) 105   Temp 98.6 F (37 C) (Oral)   Resp 16   SpO2 95%  Alert and oriented x 4, moving all extremities well Complained of back pain and extreme neck and upper extremity pain which did not allow him to sleep. He said he was lightheaded due to the extreme pain.  Exam is unchanged from office visit on 08/05/2022.  I offered admission which he declined. I explained that I thought it was unlikely his insurance company would cover a cervical arthroplasty if he was admitted through the ED and taken to surgery. We are still awaiting approval for the arthroplasty. I offered an ACDF in lieu of the arthroplasty. He states he does not want that and will wait for the approval.  I have prescribed oxycodone 10mg  qid prn for pain, and valium qid if needed for spasms.

## 2022-08-25 NOTE — Discharge Instructions (Signed)
You were seen for your neck pain and arm pain in the emergency department.   At home, please take the pain medication (oxycodone) and Valium that you were prescribed by the neurosurgeons.    Check your MyChart online for the results of any tests that had not resulted by the time you left the emergency department.   Follow-up with the neurosurgeons in 1 to 2 weeks.  Return immediately to the emergency department if you experience any of the following: Bowel or bladder incontinence, new weakness or numbness of your arms or legs, or any other concerning symptoms.    Thank you for visiting our Emergency Department. It was a pleasure taking care of you today.

## 2022-08-25 NOTE — ED Triage Notes (Signed)
Patient present with back pain related to a herniated disk. Patient states the pain woke him up from sleep. States the pain runs down his back and he has numbness to the right arm and hands. States this happens frequently but never this severe. Also states "this feels different".

## 2022-08-25 NOTE — ED Provider Notes (Signed)
EMERGENCY DEPARTMENT AT [facility] Provider Note   CSN: [id number] Arrival date & time: 08/25/22  4332     History  Chief Complaint  Patient presents with   Back Pain    Travis [name] is a 37 y.o. male.   Back Pain Associated symptoms: numbness and weakness   Patient presents for neck pain.  2 months ago, he developed lower neck pain without any preceding injuries.  He was referred to neurosurgery.  He saw Dr. Franky Macho in the office a month ago.  He underwent an MRI on July 3.  He was told that he has a herniated disc in C6-7 which is causing nerve compression.  He has plans for surgery but is currently in the approval process with his insurance company.  Current pain regimen includes oxycodone, Tylenol, and ibuprofen.  He has done steroid taper before but is not currently on a steroid.  Yesterday he was doing fine.  Tonight, he awoke with severe worsening of pain.  He describes ongoing pain, weakness, and numbness in his right arm that is also worsened tonight.  Current pain is 10/10 in severity.     Home Medications Prior to Admission medications   Medication Sig Start Date End Date Taking? Authorizing Provider  albuterol (PROVENTIL) (2.5 MG/3ML) 0.083% nebulizer solution  09/28/16   [provider]  albuterol (VENTOLIN HFA) 108 (90 Base) MCG/ACT inhaler Inhale into the lungs.    [provider]  amoxicillin (AMOXIL) 500 MG capsule Take 2 capsules (1,000 mg total) by mouth now then 1 capsule 3 times a day until gone 07/20/22     budesonide-formoterol (SYMBICORT) 160-4.5 MCG/ACT inhaler Inhale 2 puffs into the lungs at bedtime. 09/17/21   Alfonse Spruce, MD  Carbinoxamine Maleate 4 MG TABS Take 1 tablet (4 mg total) by mouth every 8 (eight) hours as needed. 09/17/21 10/17/21  Alfonse Spruce, MD  Carbinoxamine Maleate 4 MG TABS Take 1 tablet (4 mg total) by mouth every 8 (eight) hours as needed. 09/19/21   Alfonse Spruce, MD   cyclobenzaprine (FLEXERIL) 10 MG tablet Take 1 tablet (10 mg) by mouth 3 times a day as needed for muscle spasms 06/29/22     cyclobenzaprine (FLEXERIL) 10 MG tablet Take 1 tablet by mouth 3 times daily as needed for muscle spasms for up to 10 days 07/06/22     cyclobenzaprine (FLEXERIL) 10 MG tablet Take 1 tablet (10 mg total) by mouth 3 (three) times daily as needed for muscle spasms for up to 10 days. 07/21/22     ibuprofen (ADVIL) 600 MG tablet Take 1 tablet (600 mg total) by mouth every 6-8 hours as needed for pain 07/20/22     Melatonin 3 MG CAPS Take 3 mg by mouth at bedtime.    [provider]  methylPREDNISolone (MEDROL DOSEPAK) 4 MG TBPK tablet take by mouth as directed per package instructions 07/22/22     omeprazole (PRILOSEC) 40 MG capsule Take 1 capsule (40 mg total) by mouth in the morning and at bedtime. 08/07/22   Alfonse Spruce, MD  oxyCODONE (OXY IR/ROXICODONE) 5 MG immediate release tablet Take 1 tablet (5 mg total) by mouth every 6 (six) hours as needed. 08/20/22     oxyCODONE-acetaminophen (PERCOCET/ROXICET) 5-325 MG tablet Take 1 tablet by mouth every 4 (four) hours as needed for pain 07/20/22     predniSONE (DELTASONE) 20 MG tablet Take 2 tablets by mouth for 3 days then take 1  tablet daily for 3 days then take 1/2 tablet daily for 3 days 06/26/22     SYMBICORT 160-4.5 MCG/ACT inhaler Inhale 2 puffs into the lungs at bedtime. 09/19/21   Alfonse Spruce, MD  traMADol (ULTRAM) 50 MG tablet Take one tablet (50 mg dose) by mouth every 8 (eight) hours as needed for Pain for up to 3 days. Max Daily Amount: 150 mg 06/29/22     traMADol (ULTRAM) 50 MG tablet Take 1 tablet (50 mg) by mouth every 6 hours as needed. 07/24/22     ZADITOR 0.025 % ophthalmic solution Place 1 drop into both eyes 2 (two) times daily as needed. 09/25/21   Alfonse Spruce, MD      Allergies    Patient has no known allergies.    Review of Systems   Review of Systems  Musculoskeletal:  Positive for  neck pain.  Neurological:  Positive for weakness and numbness.  All other systems reviewed and are negative.   Physical Exam Updated Vital Signs BP (!) 147/87 (BP Location: Left Arm)   Pulse (!) 108   Temp 98.1 F (36.7 C) (Oral)   Resp 17   SpO2 98%  Physical Exam Vitals and nursing note reviewed.  Constitutional:      General: He is not in acute distress.    Appearance: Normal appearance. He is well-developed. He is not ill-appearing, toxic-appearing or diaphoretic.  HENT:     Head: Normocephalic and atraumatic.     Right Ear: External ear normal.     Left Ear: External ear normal.     Nose: Nose normal.     Mouth/Throat:     Mouth: Mucous membranes are moist.  Eyes:     Extraocular Movements: Extraocular movements intact.     Conjunctiva/sclera: Conjunctivae normal.  Cardiovascular:     Rate and Rhythm: Normal rate and regular rhythm.  Pulmonary:     Effort: Pulmonary effort is normal. No respiratory distress.  Abdominal:     General: There is no distension.     Palpations: Abdomen is soft.     Tenderness: There is no abdominal tenderness.  Musculoskeletal:        General: No swelling. Normal range of motion.     Cervical back: Normal range of motion and neck supple.     Right lower leg: No edema.     Left lower leg: No edema.  Skin:    General: Skin is warm and dry.     Coloration: Skin is not jaundiced or pale.  Neurological:     Mental Status: He is alert and oriented to person, place, and time.     Sensory: No sensory deficit.     Motor: Weakness present.  Psychiatric:        Mood and Affect: Mood normal.        Behavior: Behavior normal.     ED Results / Procedures / Treatments   Labs (all labs ordered are listed, but only abnormal results are displayed) Labs Reviewed  BASIC METABOLIC PANEL - Abnormal; Notable for the following components:      Result Value   Glucose, Bld 100 (*)    All other components within normal limits  CBC WITH  DIFFERENTIAL/PLATELET    EKG None  Radiology MR Cervical Spine Wo Contrast  Result Date: 08/25/2022 CLINICAL DATA:  37 year old male with neck pain radiating to the upper back. Numbness in the right arm and hand. EXAM: MRI CERVICAL SPINE WITHOUT CONTRAST TECHNIQUE: Multiplanar,  multisequence MR imaging of the cervical spine was performed. No intravenous contrast was administered. COMPARISON:  Cervical spine MRI last month 07/22/2022. FINDINGS: Alignment: Stable straightening of cervical lordosis. No significant scoliosis or spondylolisthesis. Vertebrae: Visualized bone marrow signal is within normal limits. No marrow edema or evidence of acute osseous abnormality. Cord: Degenerative cervical spinal cord mass effect (detailed below), but no cord signal abnormality is identified. Posterior Fossa, vertebral arteries, paraspinal tissues: Cervicomedullary junction is within normal limits. Negative visible posterior fossa. Preserved major vascular flow voids in the neck. Negative visible neck soft tissues, lung apices. Disc levels: C2-C3:  Stable and negative. C3-C4: Bilateral posterior/foraminal disc bulging which is greater on the left (series 5, image 10), stable since last month. Borderline to mild associated spinal stenosis. Severe left C4 neural foraminal stenosis. Mild right C4 foraminal stenosis. C4-C5: Subtle central disc bulge or protrusion has developed here on series 8, image 15. But there is no associated stenosis. C5-C6:  Minimal disc bulging.  No stenosis. C6-C7: Bulky rightward disc extrusion, with slightly more posterior and caudal migration of disc when compared to 07/22/2022 (series 5, image 4 and series 8, images 24 and 25). Up to moderate spinal stenosis here and right hemi cord mass effect (image 25) appears slightly progressed. Underlying mild circumferential disc bulging. Mild left C7 foraminal stenosis is stable. Severe right C7 foraminal stenosis appears increased. C7-T1:  Mild facet  hypertrophy.  No stenosis. Negative visible upper thoracic levels. IMPRESSION: 1. Bulky rightward disc extrusion at C6-C7 appears mildly progressed from cervical MRI last month. 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 Severe right neural foraminal stenosis appears increased. Query right C7 radiculitis. 2. Stable left greater than right foraminal disc bulging/herniation at C3-C4. Borderline to mild spinal stenosis but severe left neural foraminal stenosis. Query left C4 radiculitis. 3. Other levels appear stable; small central disc bulge or protrusion at C4-C5 without stenosis might be new. Electronically Signed   By: Odessa Fleming M.D.   On: 08/25/2022 05:59    Procedures Procedures    Medications Ordered in ED Medications  oxyCODONE-acetaminophen (PERCOCET/ROXICET) 5-325 MG per tablet 1 tablet (1 tablet Oral Given 08/25/22 0333)  HYDROmorphone (DILAUDID) injection 1 mg (1 mg Intravenous Given 08/25/22 0457)  ketorolac (TORADOL) 15 MG/ML injection 15 mg (15 mg Intravenous Given 08/25/22 0456)  HYDROmorphone (DILAUDID) injection 1 mg (1 mg Intravenous Given 08/25/22 0643)  dexamethasone (DECADRON) injection 10 mg (10 mg Intravenous Given 08/25/22 5621)    ED Course/ Medical Decision Making/ A&P                                 Medical Decision Making Amount and/or Complexity of Data Reviewed Labs: ordered. Radiology: ordered.  Risk Prescription drug management.   This patient presents to the ED for concern of neck pain, this involves an extensive number of treatment options, and is a complaint that carries with it a high risk of complications and morbidity.  The differential diagnosis includes worsening of known disc herniation, nerve compression, occult injury   Co morbidities that complicate the patient evaluation  Anxiety, depression, asthma   Additional history obtained:  Additional history obtained from N/A External records from  outside source obtained and reviewed including EMR   Lab Tests:  I Ordered, and personally interpreted labs.  The pertinent results include: Normal kidney function, normal electrolytes, normal hemoglobin, no leukocytosis   Imaging  Studies ordered:  I ordered imaging studies including MRI cervical spine I independently visualized and interpreted imaging which showed progression of right upper disc extrusion at C6/7 with associated spinal stenosis and right hemicord mass effect without spinal cord signal abnormality; increased right neural foraminal stenosis at this level; stable C3-4 disc herniation when compared to prior imaging; possible new C4-5 small central disc protrusion I agree with the radiologist interpretation   Cardiac Monitoring: / EKG:  The patient was maintained on a cardiac monitor.  I personally viewed and interpreted the cardiac monitored which showed an underlying rhythm of: Sinus rhythm   Consultations Obtained:  I requested consultation with the neurosurgery team, Megan,  and discussed lab and imaging findings as well as pertinent plan - they recommend: Ongoing pain control, neurosurgery will see in consult   Problem List / ED Course / Critical interventions / Medication management  Patient presenting for worsened pain in area of lower neck.  This occurred tonight, during sleep.  He has known herniated disc with nerve compression and C6-7.  He is followed by Dr. Franky Macho who does have plans for decompressive surgery.  On arrival, vital signs notable for hypertension tachycardia.  On exam, patient appears uncomfortable.  Current pain is 10/10 in severity.  I suspect the tachycardia and hypertension are secondary to pain.  He was given Percocet in triage with no relief.  He has 4/5 strength in right arm and hand.  No other neurodeficits are appreciated.  He does endorse a numb sensation in medial nerve distribution of right hand as well as a fullness feeling in his axilla.   Given the acute change, patient to undergo repeat MRI.  Dilaudid and Toradol ordered for analgesia.  Patient underwent MRI which showed progression of prior findings.  On reassessment, patient Dors is ongoing severe pain.  Additional Dilaudid was ordered.  I spoke with neurosurgery team, Aundra Millet, who states that someone will come and see the patient.  She advised ongoing pain control for continued plan of outpatient management.  Care of patient was signed out to oncoming ED provider. I ordered medication including Toradol, Dilaudid, Decadron for neck pain with radiculopathy Reevaluation of the patient after these medicines showed that the patient improved I have reviewed the patients home medicines and have made adjustments as needed   Social Determinants of Health:  Has access to outpatient care         Final Clinical Impression(s) / ED Diagnoses Final diagnoses:  Neck pain    Rx / DC Orders ED Discharge Orders     None         Gloris Manchester, MD 08/25/22 [phone]

## 2022-08-31 ENCOUNTER — Other Ambulatory Visit (HOSPITAL_COMMUNITY): Payer: Self-pay

## 2022-09-01 ENCOUNTER — Other Ambulatory Visit (HOSPITAL_COMMUNITY): Payer: Self-pay

## 2022-09-02 ENCOUNTER — Other Ambulatory Visit (HOSPITAL_COMMUNITY): Payer: Self-pay

## 2022-09-02 DIAGNOSIS — M50223 Other cervical disc displacement at C6-C7 level: Secondary | ICD-10-CM | POA: Diagnosis not present

## 2022-09-03 ENCOUNTER — Other Ambulatory Visit (HOSPITAL_COMMUNITY): Payer: Self-pay

## 2022-09-03 MED ORDER — TIZANIDINE HCL 4 MG PO TABS
4.0000 mg | ORAL_TABLET | Freq: Three times a day (TID) | ORAL | 0 refills | Status: DC | PRN
  Filled 2022-09-03: qty 60, 20d supply, fill #0

## 2022-09-04 ENCOUNTER — Other Ambulatory Visit (HOSPITAL_COMMUNITY): Payer: Self-pay

## 2022-09-09 ENCOUNTER — Other Ambulatory Visit (HOSPITAL_COMMUNITY): Payer: Self-pay

## 2022-09-13 ENCOUNTER — Other Ambulatory Visit (HOSPITAL_COMMUNITY): Payer: Self-pay

## 2022-09-13 MED ORDER — OXYCODONE HCL 5 MG PO TABS
5.0000 mg | ORAL_TABLET | Freq: Four times a day (QID) | ORAL | 0 refills | Status: DC | PRN
  Filled 2022-09-13: qty 60, 15d supply, fill #0

## 2022-09-14 ENCOUNTER — Other Ambulatory Visit (HOSPITAL_COMMUNITY): Payer: Self-pay

## 2022-09-30 ENCOUNTER — Other Ambulatory Visit (HOSPITAL_COMMUNITY): Payer: Self-pay

## 2022-09-30 MED ORDER — OXYCODONE HCL 5 MG PO TABS
5.0000 mg | ORAL_TABLET | Freq: Four times a day (QID) | ORAL | 0 refills | Status: DC
  Filled 2022-09-30: qty 60, 15d supply, fill #0

## 2022-10-10 ENCOUNTER — Other Ambulatory Visit (HOSPITAL_COMMUNITY): Payer: Self-pay

## 2022-10-12 ENCOUNTER — Other Ambulatory Visit (HOSPITAL_COMMUNITY): Payer: Self-pay

## 2022-10-12 MED ORDER — OXYCODONE HCL 5 MG PO TABS
5.0000 mg | ORAL_TABLET | Freq: Four times a day (QID) | ORAL | 0 refills | Status: DC | PRN
  Filled 2022-10-16: qty 30, 8d supply, fill #0

## 2022-10-16 ENCOUNTER — Other Ambulatory Visit (HOSPITAL_COMMUNITY): Payer: Self-pay

## 2022-11-02 ENCOUNTER — Other Ambulatory Visit (HOSPITAL_COMMUNITY): Payer: Self-pay

## 2022-11-02 MED ORDER — HYDROCODONE-ACETAMINOPHEN 7.5-325 MG PO TABS
1.0000 | ORAL_TABLET | Freq: Four times a day (QID) | ORAL | 0 refills | Status: DC | PRN
  Filled 2022-11-02: qty 70, 18d supply, fill #0

## 2022-11-03 ENCOUNTER — Other Ambulatory Visit (HOSPITAL_COMMUNITY): Payer: Self-pay

## 2022-11-03 ENCOUNTER — Other Ambulatory Visit: Payer: Self-pay

## 2022-11-23 ENCOUNTER — Other Ambulatory Visit (HOSPITAL_COMMUNITY): Payer: Self-pay

## 2022-11-23 MED ORDER — HYDROCODONE-ACETAMINOPHEN 7.5-325 MG PO TABS
1.0000 | ORAL_TABLET | Freq: Four times a day (QID) | ORAL | 0 refills | Status: DC
  Filled 2022-11-23: qty 70, 18d supply, fill #0

## 2022-12-07 DIAGNOSIS — M50223 Other cervical disc displacement at C6-C7 level: Secondary | ICD-10-CM | POA: Diagnosis not present

## 2022-12-07 DIAGNOSIS — Z6831 Body mass index (BMI) 31.0-31.9, adult: Secondary | ICD-10-CM | POA: Diagnosis not present

## 2022-12-10 ENCOUNTER — Other Ambulatory Visit (HOSPITAL_COMMUNITY): Payer: Self-pay

## 2022-12-10 MED ORDER — HYDROCODONE-ACETAMINOPHEN 7.5-325 MG PO TABS
1.0000 | ORAL_TABLET | Freq: Four times a day (QID) | ORAL | 0 refills | Status: DC | PRN
  Filled 2022-12-10: qty 70, 18d supply, fill #0

## 2022-12-28 DIAGNOSIS — Z79899 Other long term (current) drug therapy: Secondary | ICD-10-CM | POA: Diagnosis not present

## 2022-12-28 DIAGNOSIS — Z0000 Encounter for general adult medical examination without abnormal findings: Secondary | ICD-10-CM | POA: Diagnosis not present

## 2022-12-28 DIAGNOSIS — R350 Frequency of micturition: Secondary | ICD-10-CM | POA: Diagnosis not present

## 2023-01-01 ENCOUNTER — Other Ambulatory Visit (HOSPITAL_COMMUNITY): Payer: Self-pay

## 2023-01-01 MED ORDER — HYDROCODONE-ACETAMINOPHEN 5-325 MG PO TABS
1.0000 | ORAL_TABLET | Freq: Four times a day (QID) | ORAL | 0 refills | Status: DC
  Filled 2023-01-01: qty 70, 18d supply, fill #0

## 2023-01-04 ENCOUNTER — Other Ambulatory Visit (HOSPITAL_COMMUNITY): Payer: Self-pay

## 2023-01-04 DIAGNOSIS — E559 Vitamin D deficiency, unspecified: Secondary | ICD-10-CM | POA: Diagnosis not present

## 2023-01-04 DIAGNOSIS — F411 Generalized anxiety disorder: Secondary | ICD-10-CM | POA: Diagnosis not present

## 2023-01-04 DIAGNOSIS — J4530 Mild persistent asthma, uncomplicated: Secondary | ICD-10-CM | POA: Diagnosis not present

## 2023-01-04 DIAGNOSIS — Z0000 Encounter for general adult medical examination without abnormal findings: Secondary | ICD-10-CM | POA: Diagnosis not present

## 2023-01-04 DIAGNOSIS — Z13220 Encounter for screening for lipoid disorders: Secondary | ICD-10-CM | POA: Diagnosis not present

## 2023-01-04 DIAGNOSIS — F3181 Bipolar II disorder: Secondary | ICD-10-CM | POA: Diagnosis not present

## 2023-01-04 MED ORDER — BUSPIRONE HCL 5 MG PO TABS
5.0000 mg | ORAL_TABLET | Freq: Two times a day (BID) | ORAL | 0 refills | Status: DC
  Filled 2023-01-04: qty 60, 30d supply, fill #0

## 2023-01-18 ENCOUNTER — Other Ambulatory Visit (HOSPITAL_COMMUNITY): Payer: Self-pay

## 2023-01-18 MED ORDER — HYDROCODONE-ACETAMINOPHEN 5-325 MG PO TABS
1.0000 | ORAL_TABLET | Freq: Four times a day (QID) | ORAL | 0 refills | Status: DC
  Filled 2023-01-18: qty 33, 9d supply, fill #0

## 2023-01-29 ENCOUNTER — Other Ambulatory Visit (HOSPITAL_BASED_OUTPATIENT_CLINIC_OR_DEPARTMENT_OTHER): Payer: Self-pay

## 2023-01-29 ENCOUNTER — Other Ambulatory Visit (HOSPITAL_COMMUNITY): Payer: Self-pay

## 2023-01-29 MED ORDER — TRAMADOL HCL 50 MG PO TABS
50.0000 mg | ORAL_TABLET | Freq: Four times a day (QID) | ORAL | 0 refills | Status: DC | PRN
  Filled 2023-01-29: qty 60, 15d supply, fill #0

## 2023-02-01 DIAGNOSIS — E559 Vitamin D deficiency, unspecified: Secondary | ICD-10-CM | POA: Diagnosis not present

## 2023-02-02 ENCOUNTER — Other Ambulatory Visit (HOSPITAL_COMMUNITY): Payer: Self-pay

## 2023-02-02 MED ORDER — VITAMIN D3 125 MCG (5000 UT) PO CAPS
ORAL_CAPSULE | ORAL | 3 refills | Status: AC
  Filled 2023-02-02: qty 90, 90d supply, fill #0

## 2023-02-05 ENCOUNTER — Other Ambulatory Visit (HOSPITAL_COMMUNITY): Payer: Self-pay

## 2023-02-17 ENCOUNTER — Other Ambulatory Visit (HOSPITAL_COMMUNITY): Payer: Self-pay

## 2023-02-17 ENCOUNTER — Other Ambulatory Visit (HOSPITAL_BASED_OUTPATIENT_CLINIC_OR_DEPARTMENT_OTHER): Payer: Self-pay

## 2023-02-17 ENCOUNTER — Other Ambulatory Visit: Payer: Self-pay

## 2023-02-17 MED ORDER — TRAMADOL HCL 50 MG PO TABS
50.0000 mg | ORAL_TABLET | Freq: Four times a day (QID) | ORAL | 0 refills | Status: DC | PRN
  Filled 2023-02-17 (×2): qty 60, 15d supply, fill #0

## 2023-04-27 ENCOUNTER — Telehealth: Payer: Self-pay

## 2023-04-27 ENCOUNTER — Other Ambulatory Visit (HOSPITAL_COMMUNITY): Payer: Self-pay

## 2023-04-27 MED ORDER — OMEPRAZOLE 40 MG PO CPDR
40.0000 mg | DELAYED_RELEASE_CAPSULE | Freq: Two times a day (BID) | ORAL | 0 refills | Status: DC
  Filled 2023-04-27: qty 180, 90d supply, fill #0

## 2023-04-27 NOTE — Telephone Encounter (Signed)
 Patients wife expressed that he was in need of refills of his omeprazole. Refills have been sent and he also scheduled a follow ups appointment.

## 2023-04-28 ENCOUNTER — Other Ambulatory Visit (HOSPITAL_COMMUNITY): Payer: Self-pay

## 2023-06-24 ENCOUNTER — Other Ambulatory Visit: Payer: Self-pay

## 2023-06-24 ENCOUNTER — Encounter: Payer: Self-pay | Admitting: Allergy & Immunology

## 2023-06-24 ENCOUNTER — Ambulatory Visit (INDEPENDENT_AMBULATORY_CARE_PROVIDER_SITE_OTHER): Admitting: Allergy & Immunology

## 2023-06-24 VITALS — BP 110/80 | HR 87 | Temp 98.8°F | Resp 16 | Ht 67.0 in

## 2023-06-24 DIAGNOSIS — J4530 Mild persistent asthma, uncomplicated: Secondary | ICD-10-CM

## 2023-06-24 DIAGNOSIS — J302 Other seasonal allergic rhinitis: Secondary | ICD-10-CM

## 2023-06-24 DIAGNOSIS — K219 Gastro-esophageal reflux disease without esophagitis: Secondary | ICD-10-CM | POA: Diagnosis not present

## 2023-06-24 DIAGNOSIS — J3089 Other allergic rhinitis: Secondary | ICD-10-CM | POA: Diagnosis not present

## 2023-06-24 MED ORDER — FLUTICASONE FUROATE-VILANTEROL 200-25 MCG/ACT IN AEPB
1.0000 | INHALATION_SPRAY | Freq: Every day | RESPIRATORY_TRACT | 3 refills | Status: DC
  Filled 2023-06-24: qty 180, 90d supply, fill #0

## 2023-06-24 MED ORDER — OMEPRAZOLE 40 MG PO CPDR
40.0000 mg | DELAYED_RELEASE_CAPSULE | Freq: Two times a day (BID) | ORAL | 4 refills | Status: DC
  Filled 2023-06-24: qty 180, 90d supply, fill #0

## 2023-06-24 MED ORDER — LEVOCETIRIZINE DIHYDROCHLORIDE 5 MG PO TABS
5.0000 mg | ORAL_TABLET | Freq: Every day | ORAL | 4 refills | Status: AC | PRN
  Filled 2023-06-24: qty 90, 90d supply, fill #0

## 2023-06-24 NOTE — Patient Instructions (Addendum)
 1. Mild intermittent asthma, uncomplicated - Lung testing looked good today. - Spacer sample and demonstration provided. - Daily controller medication(s): Breo 200/25mcg one puff once daily - Prior to physical activity: albuterol  2 puffs 10-15 minutes before physical activity. - Rescue medications: albuterol  4 puffs every 4-6 hours as needed - Asthma control goals:  * Full participation in all desired activities (may need albuterol  before activity) * Albuterol  use two time or less a week on average (not counting use with activity) * Cough interfering with sleep two time or less a month * Oral steroids no more than once a year * No hospitalizations  2. Seasonal and perennial allergic rhinitis - Testing at the last visit: grasses, ragweed, trees, indoor molds, outdoor molds, dust mites, cat, dog, cockroach, and horse. - Copy of test results provided.  - Avoidance measures provided. - Continue taking: Xyzal  (levocetirizine) 5mg    3. Gastroesophageal reflux disease - Continue with omeprazole  twice daily.  4. Return in about 1 year (around 06/23/2024). You can have the follow up appointment with a new thing for her today.Dr. Idolina Maker or a Nurse Practicioner (our Nurse Practitioners are excellent and always have Physician oversight!).    Please inform us  of any Emergency Department visits, hospitalizations, or changes in symptoms. Call us  before going to the ED for breathing or allergy symptoms since we might be able to fit you in for a sick visit. Feel free to contact us  anytime with any questions, problems, or concerns.  It was a pleasure to see you again today!  Websites that have reliable patient information: 1. American Academy of Asthma, Allergy, and Immunology: www.aaaai.org 2. Food Allergy Research and Education (FARE): foodallergy.org 3. Mothers of Asthmatics: http://www.asthmacommunitynetwork.org 4. American College of Allergy, Asthma, and Immunology:  www.acaai.org      "Like" us  on Facebook and Instagram for our latest updates!      A healthy democracy works best when [employer] participate! Make sure you are registered to vote! If you have moved or changed any of your contact information, you will need to get this updated before voting! Scan the QR codes below to learn more!

## 2023-06-24 NOTE — Progress Notes (Signed)
 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 Mild intermittent asthma, uncomplicated - Lung testing looked good today. - Spacer sample and demonstration provided. - Daily controller medication(s): Breo 200/25mcg one puff once daily - Prior to physical activity: albuterol  2 puffs 10-15 minutes before physical activity. - Rescue medications: albuterol  4 puffs every 4-6 hours as needed - Asthma control goals:  * Full participation in all desired activities (may need albuterol  before activity) * Albuterol  use two time or less a week on average (not counting use with activity) * Cough interfering with sleep two time or less a month * Oral steroids no more than once a year * No hospitalizations  2. Seasonal and perennial allergic rhinitis - Testing at the last visit: grasses, ragweed, trees, indoor molds, outdoor molds, dust mites, cat, dog, cockroach, and horse. - Copy of test results provided.  - Avoidance measures provided. - Continue taking: Xyzal  (levocetirizine) 5mg    3. Gastroesophageal reflux disease - Continue with omeprazole  twice daily.  4. Return in about 1 year (around 06/23/2024). You can have the follow up appointment with a new thing for her today.Dr. Idolina Maker or a Nurse Practicioner (our Nurse  Practitioners are excellent and always have Physician oversight!).     Subjective:   Travis [name] is a 38 y.o. male presenting today for follow up of  Chief Complaint  Patient presents with   Asthma    Travis [name] has a history of the following: Patient Active Problem List   Diagnosis Date Noted   Ingrown nail 04/04/2013   Painful legs and moving toes of left foot 04/04/2013   Allergic rhinitis 08/25/2012   SEBORRHEIC DERMATITIS 01/30/2010   ATOPIC RHINITIS 03/01/2009   ANXIETY 01/03/2008   DEPRESSION 01/03/2008   Mild persistent asthma in adult without complication 01/03/2008   PEPTIC ULCER DISEASE 01/03/2008    History obtained from: chart review and patient.  Discussed the use of AI scribe software for clinical note transcription with the patient and/or guardian, who gave verbal consent to proceed.  Travis [name] is a 38 y.o. male presenting for a follow up visit.  He was last seen in August 2023.  At that time, lung testing looked good.  He was using his albuterol  frequently, so we started Symbicort  160 mcg 2 puffs twice daily.  He had testing that was positive to multiple indoor and outdoor allergens.  We started her on carbinoxamine  4 mL's every 8 hours as needed.  GERD was controlled with omeprazole  twice daily.  Since last visit, he has done well.  Asthma/Respiratory Symptom History: His asthma is generally well-controlled, but he uses his albuterol  inhaler every night, particularly when lying flat. He describes a sensation as if his lungs are signaling him to use the inhaler. He uses two puffs, which alleviates the symptoms. He previously used [employer], which worked  well for him, but maintenance inhalers are challenging due to cost and the need for daily use. When he used Breo consistently, he did not need the albuterol  inhaler.  Allergic Rhinitis Symptom History: For allergies, he uses levocetirizine (Xyzal ) as needed, which he finds effective. He does not take it daily but  uses it when necessary, such as after contact with his cat.  GERD Symptom History: He is currently taking omeprazole  40 mg twice a day for reflux, which he states works perfectly. He has not experienced heartburn in months unless he skips a dose or delays taking it in the morning. He recently refilled his prescription but is out of refills.  He underwent surgery for a C6 disc replacement in August, which required a week off work and two weeks of light duty. He did not feel comfortable returning to the gym until February, but since then, he has been attending five days a week and reports doing well.   Otherwise, there have been no changes to his past medical history, surgical history, family history, or social history.    Review of systems otherwise negative other than that mentioned in the HPI.    Objective:   Blood pressure 110/80, pulse 87, temperature 98.8 F (37.1 C), resp. rate 16, height 5\' 7"  (1.702 m), SpO2 97%. Body mass index is 30.54 kg/m.    Physical Exam Vitals reviewed.  Constitutional:      Appearance: He is well-developed.     Comments: Very friendly.  Cooperative with the exam.  HENT:     Head: Normocephalic and atraumatic.     Right Ear: Tympanic membrane, ear canal and external ear normal. No drainage, swelling or tenderness. Tympanic membrane is not injected, scarred, erythematous, retracted or bulging.     Left Ear: Tympanic membrane, ear canal and external ear normal. No drainage, swelling or tenderness. Tympanic membrane is not injected, scarred, erythematous, retracted or bulging.     Nose: No nasal deformity, septal deviation, mucosal edema or rhinorrhea.     Right Turbinates: Enlarged, swollen and pale.     Left Turbinates: Enlarged, swollen and pale.     Right Sinus: No maxillary sinus tenderness or frontal sinus tenderness.     Left Sinus: No maxillary sinus tenderness or frontal sinus tenderness.     Comments: No nasal polyps.    Mouth/Throat:      Mouth: Mucous membranes are not pale and not dry.     Pharynx: Uvula midline.  Eyes:     General: Allergic shiner present.        Right eye: No discharge.        Left eye: No discharge.     Conjunctiva/sclera: Conjunctivae normal.     Right eye: Right conjunctiva is not injected. No chemosis.    Left eye: Left conjunctiva is not injected. No chemosis.    Pupils: Pupils are equal, round, and reactive to light.  Cardiovascular:     Rate and Rhythm: Normal rate and regular rhythm.     Heart sounds: Normal heart sounds.  Pulmonary:     Effort: Pulmonary effort is normal. No tachypnea, accessory muscle usage or respiratory distress.     Breath sounds: Normal breath sounds. No wheezing, rhonchi or rales.     Comments: Moving air well in all lung fields.  No increased work of breathing. Chest:     Chest wall: No tenderness.  Abdominal:     Tenderness: There is no abdominal tenderness. There is no guarding or  rebound.  Lymphadenopathy:     Head:     Right side of head: No submandibular, tonsillar or occipital adenopathy.     Left side of head: No submandibular, tonsillar or occipital adenopathy.     Cervical: No cervical adenopathy.  Skin:    General: Skin is warm.     Capillary Refill: Capillary refill takes less than 2 seconds.     Coloration: Skin is not pale.     Findings: No abrasion, erythema, petechiae or rash. Rash is not papular, urticarial or vesicular.     Comments: Multiple space themed tattoos.  Neurological:     Mental Status: He is alert.  Psychiatric:        Behavior: Behavior is cooperative.      Diagnostic studies: none       Drexel Gentles, MD  [facility] 

## 2023-06-25 ENCOUNTER — Other Ambulatory Visit: Payer: Self-pay

## 2023-06-25 ENCOUNTER — Other Ambulatory Visit (HOSPITAL_COMMUNITY): Payer: Self-pay

## 2023-07-05 ENCOUNTER — Other Ambulatory Visit (HOSPITAL_COMMUNITY): Payer: Self-pay

## 2023-07-27 ENCOUNTER — Other Ambulatory Visit (HOSPITAL_COMMUNITY): Payer: Self-pay

## 2023-07-27 ENCOUNTER — Telehealth: Payer: Self-pay | Admitting: Allergy & Immunology

## 2023-07-27 MED ORDER — BREZTRI AEROSPHERE 160-9-4.8 MCG/ACT IN AERO
2.0000 | INHALATION_SPRAY | Freq: Two times a day (BID) | RESPIRATORY_TRACT | 5 refills | Status: AC
  Filled 2023-07-27: qty 10.7, 30d supply, fill #0
  Filled 2023-09-27: qty 10.7, 30d supply, fill #1
  Filled 2023-12-23: qty 10.7, 30d supply, fill #2

## 2023-07-27 MED ORDER — ALBUTEROL SULFATE HFA 108 (90 BASE) MCG/ACT IN AERS
2.0000 | INHALATION_SPRAY | RESPIRATORY_TRACT | 2 refills | Status: AC | PRN
  Filled 2023-07-27: qty 6.7, 17d supply, fill #0

## 2023-07-27 NOTE — Telephone Encounter (Signed)
 Patient's wife called reporting that he is using his albuterol  very frequently. She feels that he needs a controller medication. I am sending in Breztri  with the copay card to see if we can get that covered.   Marty Shaggy, MD [facility] 

## 2023-07-28 ENCOUNTER — Other Ambulatory Visit (HOSPITAL_COMMUNITY): Payer: Self-pay

## 2023-07-28 ENCOUNTER — Other Ambulatory Visit: Payer: Self-pay

## 2023-07-28 ENCOUNTER — Other Ambulatory Visit (HOSPITAL_BASED_OUTPATIENT_CLINIC_OR_DEPARTMENT_OTHER): Payer: Self-pay

## 2023-08-23 ENCOUNTER — Other Ambulatory Visit: Payer: Self-pay

## 2023-08-23 MED ORDER — VOQUEZNA 20 MG PO TABS: 1.0000 | ORAL_TABLET | Freq: Every day | ORAL | 1 refills | Status: DC

## 2023-08-24 ENCOUNTER — Telehealth: Payer: Self-pay

## 2023-08-24 NOTE — Telephone Encounter (Signed)
*  AA  Pharmacy Patient Advocate Encounter   Received notification from CoverMyMeds that prior authorization for Voquezna  20MG  tablets  is required/requested.   Insurance verification completed.   The patient is insured through [facility] .   Per test claim: PA required; PA submitted to above mentioned insurance via CoverMyMeds Key/confirmation #/EOC [facility] Status is pending

## 2023-08-25 ENCOUNTER — Other Ambulatory Visit (HOSPITAL_COMMUNITY): Payer: Self-pay

## 2023-08-25 DIAGNOSIS — M25512 Pain in left shoulder: Secondary | ICD-10-CM | POA: Diagnosis not present

## 2023-08-25 DIAGNOSIS — M898X1 Other specified disorders of bone, shoulder: Secondary | ICD-10-CM | POA: Diagnosis not present

## 2023-08-25 MED ORDER — MELOXICAM 15 MG PO TABS
15.0000 mg | ORAL_TABLET | Freq: Every day | ORAL | 0 refills | Status: DC
  Filled 2023-08-25: qty 10, 10d supply, fill #0

## 2023-08-26 ENCOUNTER — Other Ambulatory Visit (HOSPITAL_COMMUNITY): Payer: Self-pay

## 2023-08-26 NOTE — Telephone Encounter (Signed)
 Pharmacy Patient Advocate Encounter  Received notification from [facility] that Prior Authorization for Voquezna  20MG  tablets  has been DENIED.  Full denial letter will be uploaded to the media tab. See denial reason below.

## 2023-09-03 NOTE — Telephone Encounter (Signed)
 Voquezna  20MG  tablets has been DENIED please advise on alternative.

## 2023-09-04 ENCOUNTER — Other Ambulatory Visit: Payer: Self-pay

## 2023-09-04 ENCOUNTER — Encounter (HOSPITAL_COMMUNITY): Payer: Self-pay | Admitting: *Deleted

## 2023-09-04 ENCOUNTER — Emergency Department (HOSPITAL_COMMUNITY)

## 2023-09-04 ENCOUNTER — Emergency Department (HOSPITAL_COMMUNITY)
Admission: EM | Admit: 2023-09-04 | Discharge: 2023-09-05 | Disposition: A | Discharge status: Home / Self Care | Attending: Emergency Medicine | Admitting: Emergency Medicine

## 2023-09-04 DIAGNOSIS — R0602 Shortness of breath: Secondary | ICD-10-CM | POA: Diagnosis not present

## 2023-09-04 DIAGNOSIS — R072 Precordial pain: Secondary | ICD-10-CM | POA: Diagnosis not present

## 2023-09-04 DIAGNOSIS — J939 Pneumothorax, unspecified: Secondary | ICD-10-CM | POA: Diagnosis not present

## 2023-09-04 DIAGNOSIS — J45909 Unspecified asthma, uncomplicated: Secondary | ICD-10-CM | POA: Insufficient documentation

## 2023-09-04 DIAGNOSIS — R079 Chest pain, unspecified: Secondary | ICD-10-CM | POA: Diagnosis not present

## 2023-09-04 LAB — COMPREHENSIVE METABOLIC PANEL WITH GFR
ALT: 32 U/L (ref 0–44)
AST: 26 U/L (ref 15–41)
Albumin: 3.9 g/dL (ref 3.5–5.0)
Alkaline Phosphatase: 57 U/L (ref 38–126)
Anion gap: 12 (ref 5–15)
BUN: 17 mg/dL (ref 6–20)
CO2: 23 mmol/L (ref 22–32)
Calcium: 9.3 mg/dL (ref 8.9–10.3)
Chloride: 104 mmol/L (ref 98–111)
Creatinine, Ser: 1.17 mg/dL (ref 0.61–1.24)
GFR, Estimated: >60 mL/min (ref >60)
Glucose, Bld: 98 mg/dL (ref 70–99)
Potassium: 3.8 mmol/L (ref 3.5–5.1)
Sodium: 139 mmol/L (ref 135–145)
Total Bilirubin: 0.5 mg/dL (ref 0.0–1.2)
Total Protein: 6.8 g/dL (ref 6.5–8.1)

## 2023-09-04 LAB — CBC WITH DIFFERENTIAL/PLATELET
Abs Immature Granulocytes: 0.03 K/uL (ref 0.00–0.07)
Basophils Absolute: 0.1 K/uL (ref 0.0–0.1)
Basophils Relative: 1 %
Eosinophils Absolute: 0.1 K/uL (ref 0.0–0.5)
Eosinophils Relative: 1 %
HCT: 41.6 % (ref 39.0–52.0)
Hemoglobin: 12.9 g/dL — ABNORMAL LOW (ref 13.0–17.0)
Immature Granulocytes: 0 %
Lymphocytes Relative: 11 %
Lymphs Abs: 1.1 K/uL (ref 0.7–4.0)
MCH: 24.4 pg — ABNORMAL LOW (ref 26.0–34.0)
MCHC: 31 g/dL (ref 30.0–36.0)
MCV: 78.8 fL — ABNORMAL LOW (ref 80.0–100.0)
Monocytes Absolute: 0.8 K/uL (ref 0.1–1.0)
Monocytes Relative: 8 %
Neutro Abs: 7.7 K/uL (ref 1.7–7.7)
Neutrophils Relative %: 79 %
Platelets: 282 K/uL (ref 150–400)
RBC: 5.28 MIL/uL (ref 4.22–5.81)
RDW: 15.9 % — ABNORMAL HIGH (ref 11.5–15.5)
WBC: 9.8 K/uL (ref 4.0–10.5)
nRBC: 0 % (ref 0.0–0.2)

## 2023-09-04 LAB — TROPONIN I (HIGH SENSITIVITY)
Troponin I (High Sensitivity): 6 ng/L (ref <18)
Troponin I (High Sensitivity): 7 ng/L (ref <18)

## 2023-09-04 NOTE — ED Provider Triage Note (Signed)
 Emergency Medicine Provider Triage Evaluation Note  Travis [name] , a 38 y.o. male  was evaluated in triage.  Pt complains of chest pain, shortness of breath.  Patient [place] began experience electrolytes chest pain with radiation towards the back.  He denies that this is tearing but does report this felt odd to him.  He reports a squishy feeling in the epigastrium/central lower chest.  No history of any cardiac abnormalities as far as he is aware.  Review of Systems  Positive: As above Negative: As above  Physical Exam  BP 137/80   Pulse (!) 106   Temp 98.8 F (37.1 C)   Resp 18   Ht 5' 7 (1.702 m)   Wt 88.5 kg   SpO2 98%   BMI 30.56 kg/m  Gen:   Awake, no distress   Resp:  Normal effort  MSK:   Moves extremities without difficulty  Other:  No abdominal guarding but palpation towards the epigastrium considerably worsens.  Medical Decision Making  Medically screening exam initiated at 7:09 PM.  Appropriate orders placed.  Ozell DELENA Irving was informed that the remainder of the evaluation will be completed by another provider, this initial triage assessment does not replace that evaluation, and the importance of remaining in the ED until their evaluation is complete.     Khyler Eschmann A, PA-C 09/04/23 1910

## 2023-09-04 NOTE — ED Triage Notes (Signed)
 The pt was running today and he started having chest pain and some shortness of breath  since then  the pain is middle to the lt chest  hx of a pneumothorax on the lt 19 years ago

## 2023-09-05 ENCOUNTER — Emergency Department (HOSPITAL_COMMUNITY)

## 2023-09-05 DIAGNOSIS — R079 Chest pain, unspecified: Secondary | ICD-10-CM | POA: Diagnosis not present

## 2023-09-05 DIAGNOSIS — J939 Pneumothorax, unspecified: Secondary | ICD-10-CM | POA: Diagnosis not present

## 2023-09-05 MED ORDER — ONDANSETRON HCL 4 MG/2ML IJ SOLN
4.0000 mg | Freq: Once | INTRAMUSCULAR | Status: AC
  Administered 2023-09-05: 4 mg via INTRAVENOUS
  Filled 2023-09-05: qty 2

## 2023-09-05 MED ORDER — KETOROLAC TROMETHAMINE 15 MG/ML IJ SOLN
15.0000 mg | Freq: Once | INTRAMUSCULAR | Status: AC
  Administered 2023-09-05: 15 mg via INTRAVENOUS
  Filled 2023-09-05: qty 1

## 2023-09-05 MED ORDER — FENTANYL CITRATE PF 50 MCG/ML IJ SOSY
100.0000 ug | PREFILLED_SYRINGE | Freq: Once | INTRAMUSCULAR | Status: AC
  Administered 2023-09-05: 100 ug via INTRAVENOUS
  Filled 2023-09-05: qty 2

## 2023-09-05 MED ORDER — IOHEXOL 350 MG/ML SOLN
75.0000 mL | Freq: Once | INTRAVENOUS | Status: AC | PRN
  Administered 2023-09-05: 75 mL via INTRAVENOUS

## 2023-09-05 NOTE — Discharge Instructions (Addendum)
 You have mild anemia(low blood counts) Be sure to have this re-checked in one month with your primary doctor Please avoid running/physical activity until seen by cardiology  Your caregiver has diagnosed you as having chest pain that is not specific for one problem, but does not require admission.  Chest pain comes from many different causes.  SEEK IMMEDIATE MEDICAL ATTENTION IF: You have severe chest pain, especially if the pain is crushing or pressure-like and spreads to the arms, back, neck, or jaw, or if you have sweating, nausea (feeling sick to your stomach), or shortness of breath. THIS IS AN EMERGENCY. Don't wait to see if the pain will go away. Get medical help at once. Call 911 or 0 (operator). DO NOT drive yourself to the hospital.  Your chest pain gets worse and does not go away with rest.  You have an attack of chest pain lasting longer than usual, despite rest and treatment with the medications your caregiver has prescribed.  You wake from sleep with chest pain or shortness of breath.  You feel dizzy or faint.  You have chest pain not typical of your usual pain for which you originally saw your caregiver.

## 2023-09-05 NOTE — ED Provider Notes (Signed)
  [facility] AT [facility] Provider Note   CSN: [id number] Arrival date & time: 09/04/23  8177     Patient presents with: Chest Pain and Shortness of Breath   Travis [name] is a 38 y.o. male.   The history is provided by the patient.  Patient w/history of anxiety, distant history of pneumothorax presents with chest pain Patient reports he has been running and hiking recently, and today started having left-sided chest pain and shortness of breath with exertion.  He also reports feeling hot and near syncopal during his episodes.  No syncope reported.  Symptoms improved with rest.  No vomiting.  Since that time he has had intermittent pain even at rest but appears to worsen with exertion.  Denies any pleuritic pain.  No arm or leg weakness.  No known history of CAD/VTE.  No recent long distance travel. He does have a family history of CAD He reports the pain feels like a squishy feeling in his chest At times the pain does move in his back   Past Medical History:  Diagnosis Date   Anxiety    Asthma    Barrett esophagus    Depression    sees Dr. Vinie Burton in [facility]    Pneumothorax     Prior to Admission medications   Medication Sig Start Date End Date Taking? Authorizing Provider  albuterol  (VENTOLIN  HFA) 108 (90 Base) MCG/ACT inhaler Inhale 2 puffs into the lungs every 4 (four) hours as needed for wheezing or shortness of breath. 07/27/23   Iva Marty Saltness, MD  Carbinoxamine  Maleate 4 MG TABS Take 1 tablet (4 mg total) by mouth every 8 (eight) hours as needed. 09/17/21 10/17/21  Iva Marty Saltness, MD  Cholecalciferol  (VITAMIN D3) 125 MCG (5000 UT) CAPS Take one capsule (5,000 Units dose) by mouth daily. 02/02/23     fluticasone  furoate-vilanterol (BREO ELLIPTA ) 200-25 MCG/ACT AEPB Inhale 1 puff into the lungs daily. 06/24/23   Iva Marty Saltness, MD  ibuprofen  (ADVIL ) 600 MG tablet Take 1 tablet (600 mg total) by mouth every 6-8 hours as  needed for pain 07/20/22     levocetirizine (XYZAL ) 5 MG tablet Take 1 tablet (5 mg total) by mouth daily as needed. 06/24/23   Iva Marty Saltness, MD  Melatonin 3 MG CAPS Take 3 mg by mouth at bedtime. Patient not taking: Reported on 06/24/2023    [provider]  meloxicam  (MOBIC ) 15 MG tablet Take 1 tablet (15 mg total) by mouth daily. 08/25/23     omeprazole  (PRILOSEC) 40 MG capsule Take 1 capsule (40 mg total) by mouth in the morning and at bedtime. 06/24/23   Iva Marty Saltness, MD  Vonoprazan Fumarate  (VOQUEZNA ) 20 MG TABS Take 1 tablet by mouth daily. 08/23/23   Iva Marty Saltness, MD  ZADITOR  0.025 % ophthalmic solution Place 1 drop into both eyes 2 (two) times daily as needed. 09/25/21   Iva Marty Saltness, MD    Allergies: Patient has no known allergies.    Review of Systems  Constitutional:  Positive for fatigue. Negative for fever.  Cardiovascular:  Positive for chest pain.  Neurological:  Positive for light-headedness. Negative for syncope.    Updated Vital Signs BP (!) 140/83 (BP Location: Right Arm)   Pulse 85   Temp 98.5 F (36.9 C)   Resp 18   Ht 1.702 m (5' 7)   Wt 88.5 kg   SpO2 100%   BMI 30.56 kg/m   Physical  Exam CONSTITUTIONAL: Well developed/well nourished, anxious HEAD: Normocephalic/atraumatic EYES: EOMI/PERRL ENMT: Mucous membranes moist NECK: supple no meningeal signs SPINE/BACK:entire spine nontender CV: S1/S2 noted, no murmurs/rubs/gallops noted LUNGS: Lungs are clear to auscultation bilaterally, no apparent distress ABDOMEN: soft, nontender, no rebound or guarding, bowel sounds noted throughout abdomen GU:no cva tenderness NEURO: Pt is awake/alert/appropriate, moves all extremitiesx4.  No facial droop.   EXTREMITIES: pulses normal/equalx4 in all extremities, full ROM SKIN: warm, color normal PSYCH: Anxious (all labs ordered are listed, but only abnormal results are displayed) Labs Reviewed  CBC WITH DIFFERENTIAL/PLATELET -  Abnormal; Notable for the following components:      Result Value   Hemoglobin 12.9 (*)    MCV 78.8 (*)    MCH 24.4 (*)    RDW 15.9 (*)    All other components within normal limits  COMPREHENSIVE METABOLIC PANEL WITH GFR  TROPONIN I (HIGH SENSITIVITY)  TROPONIN I (HIGH SENSITIVITY)    EKG: EKG Interpretation Date/Time:  Saturday September 04 2023 18:49:54 EDT Ventricular Rate:  103 PR Interval:  130 QRS Duration:  94 QT Interval:  328 QTC Calculation: 429 R Axis:   80  Text Interpretation: Sinus tachycardia Minimal voltage criteria for LVH, may be normal variant ( Sokolow-Lyon ) No significant change since last tracing Confirmed by Midge Golas ([id number]) on 09/05/2023 12:08:08 AM  Radiology: CT Angio Chest/Abd/Pel for Dissection W and/or Wo Contrast Result Date: 09/05/2023 CLINICAL DATA:  Acute aortic syndrome suspected. Chest pain and shortness of breath while running. History of pneumothorax. EXAM: CT ANGIOGRAPHY CHEST, ABDOMEN AND PELVIS TECHNIQUE: Non-contrast CT of the chest was initially obtained. Multidetector CT imaging through the chest, abdomen and pelvis was performed using the standard protocol during bolus administration of intravenous contrast. Multiplanar reconstructed images and MIPs were obtained and reviewed to evaluate the vascular anatomy. RADIATION DOSE REDUCTION: This exam was performed according to the departmental dose-optimization program which includes automated exposure control, adjustment of the mA and/or kV according to patient size and/or use of iterative reconstruction technique. CONTRAST:  75mL OMNIPAQUE  IOHEXOL  350 MG/ML SOLN COMPARISON:  Same day chest radiograph and CTA chest 03/01/2022; CT abdomen pelvis 12/22/2007 FINDINGS: CTA CHEST FINDINGS Cardiovascular: No pericardial effusion. No pulmonary embolism. No acute aortic syndrome. Mediastinum/Nodes: Trachea and esophagus are unremarkable. Residual thymic tissue in the anterior mediastinum. Lungs/Pleura:  Paraseptal emphysema in the upper lobes. No focal consolidation, pleural effusion, or pneumothorax. Musculoskeletal: No acute fracture. Review of the MIP images confirms the above findings. CTA ABDOMEN AND PELVIS FINDINGS VASCULAR The aorta and its mesenteric, renal, and iliac artery branches are widely patent without aneurysm or dissection. Review of the MIP images confirms the above findings. NON-VASCULAR Hepatobiliary: Unremarkable. Pancreas: Unremarkable. Spleen: Unremarkable. Adrenals/Urinary Tract: Unremarkable. Stomach/Bowel: No bowel obstruction or bowel wall thickening. Normal appendix. Lymphatic: No lymphadenopathy. Reproductive: Unremarkable. Other: No free intraperitoneal fluid or air. Musculoskeletal: No acute fracture. Review of the MIP images confirms the above findings. IMPRESSION: No acute abnormality in the chest, abdomen, or pelvis. No acute aortic syndrome. Electronically Signed   By: Norman Gatlin M.D.   On: 09/05/2023 02:46   DG Chest 2 View Result Date: 09/04/2023 CLINICAL DATA:  Chest pain, shortness of breath EXAM: CHEST - 2 VIEW COMPARISON:  03/01/2022 FINDINGS: The heart size and mediastinal contours are within normal limits. Both lungs are clear. The visualized skeletal structures are unremarkable. IMPRESSION: No active cardiopulmonary disease. Electronically Signed   By: Franky Crease M.D.   On: 09/04/2023 19:32     Procedures  Medications Ordered in the ED  ketorolac  (TORADOL ) 15 MG/ML injection 15 mg (has no administration in time range)  fentaNYL  (SUBLIMAZE ) injection 100 mcg (100 mcg Intravenous Given 09/05/23 0113)  ondansetron  (ZOFRAN ) injection 4 mg (4 mg Intravenous Given 09/05/23 0114)  iohexol  (OMNIPAQUE ) 350 MG/ML injection 75 mL (75 mLs Intravenous Contrast Given 09/05/23 0225)    Clinical Course as of 09/05/23 0305  Sun Sep 05, 2023  0033 Patient presents for chest pain that occurred with exertion Initial workup is overall unremarkable and he is still  having symptoms Will proceed with CT dissection study Will need f/u with cardiology Will need f/u for his anemia [DW]  0304 Patient stable in the ER.  CT dissection study is negative for any acute aortic syndrome, negative for PE  At this point patient is safe for discharge home.  Will give urgent referral to cardiology. Patient was instructed to limit all physical activity until seen by cardiology [DW]    Clinical Course User Index [DW] Midge Golas, MD              HEART Score: 3                    Medical Decision Making Amount and/or Complexity of Data Reviewed Radiology: ordered.  Risk Prescription drug management.   This patient presents to the ED for concern of chest pain, this involves an extensive number of treatment options, and is a complaint that carries with it a high risk of complications and morbidity.  The differential diagnosis includes but is not limited to acute coronary syndrome, aortic dissection, pulmonary embolism, pericarditis, pneumothorax, pneumonia, myocarditis, pleurisy, esophageal rupture   Comorbidities that complicate the patient evaluation: Patient's presentation is complicated by their history of asthma  Additional history obtained: Additional history obtained from significant other Records reviewed outpatient records reviewed  Lab Tests: I Ordered, and personally interpreted labs.  The pertinent results include:  mild anemia  Imaging Studies ordered: I ordered imaging studies including X-ray chest  I independently visualized and interpreted imaging which showed no acute findings I agree with the radiologist interpretation   Medicines ordered and prescription drug management: I ordered medication including fentanyl   for pain  Reevaluation of the patient after these medicines showed that the patient    improved  Test Considered: Patient is low risk / negative by heart score, therefore do not feel that cardiac admission is  indicated.  Reevaluation: After the interventions noted above, I reevaluated the patient and found that they have :improved  Complexity of problems addressed: Patient's presentation is most consistent with  acute presentation with potential threat to life or bodily function  Disposition: After consideration of the diagnostic results and the patient's response to treatment,  I feel that the patent would benefit from discharge  .        Final diagnoses:  Precordial pain    ED Discharge Orders          Ordered    Ambulatory referral to Cardiology        09/05/23 0304               Midge Golas, MD 09/05/23 [phone]

## 2023-09-06 ENCOUNTER — Other Ambulatory Visit (HOSPITAL_COMMUNITY): Payer: Self-pay

## 2023-09-07 NOTE — Telephone Encounter (Signed)
 Can we call the specialty pharmacy to check on manufacturer copay card?   Marty Shaggy, MD [facility] 

## 2023-09-21 NOTE — Telephone Encounter (Signed)
 Per patient - he has tried/failed the following medications:  Omeprazole  (Prilosec) Famotidine  (Pepcid ) Rantidine (Zantac) Esomeprazole (Nexium) Lansoprazole (Prevacid)  Patient had been going to [facility] - Dr. Karyle - she has retired. Patient was advised to follow up as needed.  Sending updated message to PA Team.

## 2023-09-23 ENCOUNTER — Other Ambulatory Visit: Payer: Self-pay

## 2023-09-23 NOTE — Telephone Encounter (Signed)
 Per PA Team:  Patient will need to have this prescribed by a gastro provider, also looks like the 20mg  are not covered- but the 10mg  may be.    Forwarding updated message to provider.

## 2023-09-24 ENCOUNTER — Other Ambulatory Visit (HOSPITAL_COMMUNITY): Payer: Self-pay

## 2023-09-27 ENCOUNTER — Other Ambulatory Visit (HOSPITAL_COMMUNITY): Payer: Self-pay

## 2023-09-27 ENCOUNTER — Other Ambulatory Visit: Payer: Self-pay

## 2023-09-27 NOTE — Telephone Encounter (Signed)
 Samples have been placed on [address]. Patient aware

## 2023-09-27 NOTE — Telephone Encounter (Signed)
 Patient's wife called stating he is needing a sample of Voquenza 20 MG until the PA goes through. Wife also stated he is wanting to pick that sample up this afternoon.

## 2023-09-28 NOTE — Telephone Encounter (Signed)
 Sounds good. Thanks

## 2023-10-07 ENCOUNTER — Telehealth: Payer: Self-pay | Admitting: Allergy & Immunology

## 2023-10-07 NOTE — Telephone Encounter (Signed)
 Wife called in stating a Prior Auth for her husband's Voquenza was sent in on 09/27/2023.  Wife states they haven't heard anything about the Prior Auth and he has 1 pill left.  Is there anyway we can look into this and see if something was initiated?

## 2023-12-23 ENCOUNTER — Other Ambulatory Visit (HOSPITAL_COMMUNITY): Payer: Self-pay

## 2023-12-29 ENCOUNTER — Other Ambulatory Visit (HOSPITAL_COMMUNITY): Payer: Self-pay

## 2024-01-17 ENCOUNTER — Other Ambulatory Visit (HOSPITAL_COMMUNITY): Payer: Self-pay

## 2024-01-17 MED ORDER — SUTAB 1479-225-188 MG PO TABS
ORAL_TABLET | ORAL | 0 refills | Status: AC
  Filled 2024-01-17: qty 24, 2d supply, fill #0

## 2024-01-24 ENCOUNTER — Ambulatory Visit
Admission: EM | Admit: 2024-01-24 | Discharge: 2024-01-24 | Disposition: A | Discharge status: Home / Self Care | Attending: Family Medicine | Admitting: Family Medicine

## 2024-01-24 ENCOUNTER — Encounter: Payer: Self-pay | Admitting: Emergency Medicine

## 2024-01-24 DIAGNOSIS — B029 Zoster without complications: Secondary | ICD-10-CM

## 2024-01-24 MED ORDER — GABAPENTIN 300 MG PO CAPS: 300.0000 mg | ORAL_CAPSULE | Freq: Three times a day (TID) | ORAL | 0 refills | Status: AC

## 2024-01-24 MED ORDER — VALACYCLOVIR HCL 1 G PO TABS: 1000.0000 mg | ORAL_TABLET | Freq: Three times a day (TID) | ORAL | 0 refills | Status: AC

## 2024-01-24 NOTE — Discharge Instructions (Addendum)
 Take the valacyclovir  3 times a day for 7 days.  Take 2 doses today May take gabapentin  up to 3 times a day for nerve pain.  This can cause drowsiness, but the drowsiness wears off.  Start by taking 1 at night, then take twice a day, then take 3 times a day as tolerated May also take Tylenol  as needed for pain I would not take ibuprofen  or anti-inflammatories if you are being evaluated for a GI bleed Follow-up with your primary care doctor to discuss shingles vaccination in the future

## 2024-01-24 NOTE — ED Triage Notes (Signed)
 Patient c/o rash on the right side of back and side x 2 weeks.  The rash does burn slight itching.  Patient denies any OTC meds.  Concerned for shingles.

## 2024-01-26 ENCOUNTER — Other Ambulatory Visit (HOSPITAL_COMMUNITY): Payer: Self-pay

## 2024-02-02 ENCOUNTER — Telehealth: Payer: Self-pay

## 2024-02-02 NOTE — Telephone Encounter (Signed)
*  [facility]  Pharmacy Patient Advocate Encounter  Received notification from [facility] that Prior Authorization for Voquezna  20mg  tab has been CANCELLED due to  Previously denied due to patient needing to be seen and prescribed by [facility] provider.   Key: [id number]

## 2024-02-04 MED ORDER — VOQUEZNA 20 MG PO TABS: 1.0000 | ORAL_TABLET | Freq: Every day | ORAL | 1 refills | Status: AC

## 2024-02-04 NOTE — Telephone Encounter (Signed)
 I sent it to [facility] instead so they can run the copay card.   Marty Shaggy, MD [facility] 

## 2024-02-04 NOTE — Addendum Note (Signed)
 Addended by: IVA MARTY SALTNESS on: 02/04/2024 03:52 PM   Modules accepted: Orders

## 2024-06-22 ENCOUNTER — Ambulatory Visit: Admitting: Allergy & Immunology
